# Patient Record
Sex: Male | Born: 1985 | Race: White | Hispanic: No | Marital: Single | Smoking: Never smoker
Health system: Southern US, Community
[De-identification: ages and names within clinical notes are randomized; demographics above are authoritative.]

## PROBLEM LIST (undated history)

## (undated) DIAGNOSIS — F419 Anxiety disorder, unspecified: Secondary | ICD-10-CM

## (undated) HISTORY — DX: Anxiety disorder, unspecified: F41.9

---

## 2006-03-04 ENCOUNTER — Ambulatory Visit: Payer: Self-pay | Admitting: Family Medicine

## 2007-11-11 ENCOUNTER — Ambulatory Visit: Payer: Self-pay | Admitting: Family Medicine

## 2007-12-12 ENCOUNTER — Ambulatory Visit: Payer: Self-pay | Admitting: Family Medicine

## 2008-12-26 ENCOUNTER — Telehealth: Payer: Self-pay | Admitting: Family Medicine

## 2008-12-28 ENCOUNTER — Ambulatory Visit: Payer: Self-pay | Admitting: Family Medicine

## 2008-12-31 ENCOUNTER — Encounter: Payer: Self-pay | Admitting: Family Medicine

## 2009-01-02 ENCOUNTER — Ambulatory Visit: Payer: Self-pay | Admitting: Family Medicine

## 2009-01-17 ENCOUNTER — Ambulatory Visit: Payer: Self-pay | Admitting: Family Medicine

## 2009-01-17 DIAGNOSIS — M549 Dorsalgia, unspecified: Secondary | ICD-10-CM | POA: Insufficient documentation

## 2009-02-07 ENCOUNTER — Ambulatory Visit: Payer: Self-pay | Admitting: Family Medicine

## 2009-02-07 DIAGNOSIS — F411 Generalized anxiety disorder: Secondary | ICD-10-CM | POA: Insufficient documentation

## 2009-02-12 ENCOUNTER — Ambulatory Visit: Payer: Self-pay | Admitting: Professional

## 2009-03-12 ENCOUNTER — Ambulatory Visit: Payer: Self-pay | Admitting: Family Medicine

## 2009-03-12 DIAGNOSIS — I861 Scrotal varices: Secondary | ICD-10-CM

## 2009-03-12 DIAGNOSIS — K648 Other hemorrhoids: Secondary | ICD-10-CM | POA: Insufficient documentation

## 2009-05-08 ENCOUNTER — Ambulatory Visit: Payer: Self-pay | Admitting: Family Medicine

## 2009-05-14 ENCOUNTER — Encounter: Payer: Self-pay | Admitting: Family Medicine

## 2009-08-13 ENCOUNTER — Ambulatory Visit: Payer: Self-pay | Admitting: Family Medicine

## 2009-08-15 LAB — CONVERTED CEMR LAB
AST: 15 units/L (ref 0–37)
Alkaline Phosphatase: 59 units/L (ref 39–117)
Basophils Absolute: 0.1 10*3/uL (ref 0.0–0.1)
Bilirubin, Direct: 0 mg/dL (ref 0.0–0.3)
Calcium: 9.3 mg/dL (ref 8.4–10.5)
Creatinine, Ser: 1 mg/dL (ref 0.4–1.5)
Eosinophils Absolute: 0.4 10*3/uL (ref 0.0–0.7)
GFR calc non Af Amer: 98.43 mL/min (ref >60)
Glucose, Bld: 85 mg/dL (ref 70–99)
HDL: 52.7 mg/dL (ref >39.00)
Hemoglobin: 15.1 g/dL (ref 13.0–17.0)
Lymphocytes Relative: 23.3 % (ref 12.0–46.0)
Monocytes Relative: 6.7 % (ref 3.0–12.0)
Neutro Abs: 5.7 10*3/uL (ref 1.4–7.7)
Neutrophils Relative %: 63.9 % (ref 43.0–77.0)
Platelets: 257 10*3/uL (ref 150.0–400.0)
RDW: 12.3 % (ref 11.5–14.6)
Sodium: 141 meq/L (ref 135–145)
Total Bilirubin: 1 mg/dL (ref 0.3–1.2)
Total CHOL/HDL Ratio: 3
Triglycerides: 73 mg/dL (ref 0.0–149.0)
VLDL: 14.6 mg/dL (ref 0.0–40.0)

## 2010-05-23 ENCOUNTER — Encounter: Payer: Self-pay | Admitting: Family Medicine

## 2010-05-23 ENCOUNTER — Ambulatory Visit
Admission: RE | Admit: 2010-05-23 | Discharge: 2010-05-23 | Payer: Self-pay | Source: Home / Self Care | Attending: Family Medicine | Admitting: Family Medicine

## 2010-06-24 NOTE — Assessment & Plan Note (Signed)
Summary: CPX AND 3RD HEP SHOT/DLO   Vital Signs:  Patient profile:   25 year old male Height:      73.5 inches Weight:      196.25 pounds BMI:     25.63 Temp:     98.4 degrees F oral Pulse rate:   60 / minute Pulse rhythm:   regular BP sitting:   118 / 80  (left arm) Cuff size:   regular  Vitals Entered By: Lewanda Rife LPN (August 13, 2009 10:45 AM)  History of Present Illness: here for health mt exam   is doing well - in school / going ok and almost done still working   stays up very late at night - home from work at 2 am   wt is up 7 lb  bp good 118/80  went to urologist in dec about d/c -- all was ok   for 3rd hep B shot today up to date Td 8/10  last visit started effexor for anx (paxil did not work) Careers information officer - helped at first - and then forgot to take it - and now he is off of it  wants to stay off it for a while now  anx is much better - though nothing else has changed   is eating a bit more healthy  more exercise than previously -- some housework and push ups  is having party for his birthday party   has mole behind R ear to check/ with scab feeling good overall      Allergies (verified): 1)  ! Paxil  Past History:  Past Medical History: Last updated: 02/07/2009 anxiety  Past Surgical History: Last updated: 11/11/2007 no surgeries   Family History: Last updated: 02/07/2009 Father: - ? perhaps anx  Mother:  Siblings: 1 sister no cancer in family sister - ? anxiety (social phobia)  Social History: Last updated: 12/28/2008 Marital Status:single Children:  Occupation: Archivist non smoker alcohol- occasional   Risk Factors: Smoking Status: never (11/10/2007)  Review of Systems General:  Denies fatigue, fever, loss of appetite, and malaise. Eyes:  Denies blurring and eye pain. CV:  Denies chest pain or discomfort, lightheadness, and palpitations. Resp:  Denies cough and wheezing. GI:  Denies abdominal pain, bloody stools,  change in bowel habits, indigestion, and nausea. GU:  Denies discharge, dysuria, hematuria, urinary frequency, and urinary hesitancy. MS:  Denies joint pain, joint redness, and joint swelling. Derm:  Denies itching, lesion(s), and rash. Neuro:  Denies numbness and tingling. Psych:  Denies anxiety and depression. Endo:  Denies cold intolerance, excessive thirst, excessive urination, and heat intolerance. Heme:  Denies abnormal bruising and bleeding.  Physical Exam  General:  Well-developed,well-nourished,in no acute distress; alert,appropriate and cooperative throughout examination Head:  normocephalic, atraumatic, and no abnormalities observed.   Eyes:  vision grossly intact, pupils equal, pupils round, and pupils reactive to light.  no conjunctival pallor, injection or icterus  Ears:  R ear normal and L ear normal.   Nose:  no nasal discharge.   Mouth:  pharynx pink and moist.   Neck:  supple with full rom and no masses or thyromegally, no JVD or carotid bruit  Chest Wall:  No deformities, masses, tenderness or gynecomastia noted. Lungs:  Normal respiratory effort, chest expands symmetrically. Lungs are clear to auscultation, no crackles or wheezes. Heart:  Normal rate and regular rhythm. S1 and S2 normal without gallop, murmur, click, rub or other extra sounds. Abdomen:  Bowel sounds positive,abdomen soft and non-tender without  masses, organomegaly or hernias noted. Msk:  No deformity or scoliosis noted of thoracic or lumbar spine.  no acute joint changes  Pulses:  R and L carotid,radial,femoral,dorsalis pedis and posterior tibial pulses are full and equal bilaterally Extremities:  No clubbing, cyanosis, edema, or deformity noted with normal full range of motion of all joints.   Neurologic:  sensation intact to light touch, gait normal, and DTRs symmetrical and normal.  no tremor  Skin:  Intact without suspicious lesions or rashes small SK behind R ear  few nl appearing brown nevi and  lentigos  Cervical Nodes:  No lymphadenopathy noted Inguinal Nodes:  No significant adenopathy Psych:  normal affect, talkative and pleasant  much less anxious today   Impression & Recommendations:  Problem # 1:  HEALTH MAINTENANCE EXAM (ICD-V70.0) Assessment Comment Only reviewed health habits including diet, exercise and skin cancer prevention reviewed health maintenance list and family history  lab today Orders: Venipuncture (88416) TLB-Lipid Panel (80061-LIPID) TLB-BMP (Basic Metabolic Panel-BMET) (80048-METABOL) TLB-CBC Platelet - w/Differential (85025-CBCD) TLB-Hepatic/Liver Function Pnl (80076-HEPATIC) TLB-TSH (Thyroid Stimulating Hormone) (84443-TSH)  Problem # 2:  ANXIETY DISORDER (ICD-300.00) Assessment: Improved much imp -off effexor (although that did help) now wants to stay off med  will keep me updated on progress commended him on this  The following medications were removed from the medication list:    Effexor Xr 37.5 Mg Xr24h-cap (Venlafaxine hcl) .Marland Kitchen... 1 by mouth once daily in am  Complete Medication List: 1)  Multivitamins Tabs (Multiple vitamin) .... Take 1 tablet by mouth once a day 2)  Anusol-hc 25 Mg Supp (Hydrocortisone acetate) .Marland Kitchen.. 1 per rectum at bedtime as needed hemorroid symptoms  Other Orders: Hepatitis B Vaccine ADOLESCENT (2 dose) (60630) Admin 1st Vaccine (16010)  Patient Instructions: 1)  try to keep working on healthy diet and exercise  2)  hep B shot today 3)  labs today 4)  hold on the effexor -- and if your anxiety increases start it again   Current Allergies (reviewed today): ! PAXIL    Immunizations Administered:  Hepatitis B Vaccine # 3:    Vaccine Type: HepB Adolescent    Site: left deltoid    Mfr: Merck    Dose: 1.0 ml    Route: IM    Given by: Lewanda Rife LPN    Exp. Date: 02/05/2011    Lot #: [id number]    VIS given: 12/09/05 version given August 13, 2009.

## 2010-06-26 NOTE — Assessment & Plan Note (Signed)
Summary: ANXIETY ATTACKS/UP ALL NIGHT/JRR   Vital Signs:  Patient profile:   25 year old male Height:      73.5 inches Weight:      202.25 pounds BMI:     26.42 Temp:     98.1 degrees F oral Pulse rate:   84 / minute Pulse rhythm:   regular BP sitting:   110 / 70  (left arm) Cuff size:   large  Vitals Entered By: Delilah Shan CMA Duncan Dull) (May 23, 2010 11:08 AM) CC: Anxiety attack   History of Present Illness: Anxiety- worries about possible looming illness.  Had to take a deep breath over and over last night.  Very nervous last night.  Every night during the last week, he was up worrying about heart trouble.  He doesn't want to exercise due to concern for heart trouble.  "Going on my whole life, but worse this past week."  Prev had mult checks prev "and my heart was always fine but I still worry about this."  Unclear trigger about making this past week worse.  Doing well in school and work.  No fear of crowds, germs.  No h/o racing thoughts, no h/o manic symptoms.  No SI/HI.  "I don't think I'm depressed."  Didn't do well with counseling.  No illicits.  Prev didn't stay on paxil for a prolonged period.  I d/w patient about this and evidently he wasn't consistent with the medicine to see if really had a chance to work.  He is interested in trying it again.   Family History: Reviewed history from 02/07/2009 and no changes required. Father: - alive, ? perhaps anx  Mother: alive Siblings: 1 sister no cancer in family sister - ? anxiety/depression (social phobia)  Social History: Reviewed history from 12/28/2008 and no changes required. Marital Status:single Children:  Occupation: Archivist, Arts administrator, business admin non smoker alcohol- rare  Review of Systems       See HPI.  Otherwise negative.    Physical Exam  General:  GEN: nad, alert and oriented, slighlty anxious but this improves during the conversation HEENT: mucous membranes moist NECK: supple w/o LA CV: rrr.   no murmur PULM: ctab, no inc wob EXT: no edema SKIN: no acute rash    Impression & Recommendations:  Problem # 1:  ANXIETY DISORDER (ICD-300.00)  >25 min spent with patient, at least half of which was spent on counseling.  He clarified the prev paxil use.  He reports he wasn't consistent with it and would like to try it again.  Will restart with typical precautions EA:VWUJ, libido.  Will call back with update. No SI/HI. Okay for outpatient follow up.    His updated medication list for this problem includes:    Paxil 20 Mg Tabs (Paroxetine hcl) .Marland Kitchen... 1 by mouth once daily  Complete Medication List: 1)  Paxil 20 Mg Tabs (Paroxetine hcl) .Marland Kitchen.. 1 by mouth once daily  Patient Instructions: 1)  Start the paxil and call me with an update in 1-2 weeks. Let's plan on meeting again for an OV in 6 weeks- .  Let me know if you mood is getting worse in the meantime.  Take care.  Prescriptions: PAXIL 20 MG TABS (PAROXETINE HCL) 1 by mouth once daily  #90 x 3   Entered and Authorized by:   Crawford Givens MD   Signed by:   Crawford Givens MD on 05/23/2010   Method used:   Print then Give to Patient  RxID:   [id number]    Orders Added: 1)  Est. Patient Level IV [40102]    Prior Medications (reviewed today): None

## 2010-06-26 NOTE — Letter (Signed)
Summary: Out of Work  [facility]  [address] [place], [place] [address]   Phone: [phone]  Fax: [phone]    May 23, 2010   Employee:  WINDSOR GOEKEN    To Whom It May Concern:   For Medical reasons, please excuse the above named employee from work for this AM.    If you need additional information, please feel free to contact our office.         Sincerely,    Crawford Givens MD

## 2010-07-04 ENCOUNTER — Encounter: Payer: Self-pay | Admitting: Family Medicine

## 2010-07-04 ENCOUNTER — Ambulatory Visit (INDEPENDENT_AMBULATORY_CARE_PROVIDER_SITE_OTHER): Payer: 59 | Admitting: Family Medicine

## 2010-07-04 DIAGNOSIS — R21 Rash and other nonspecific skin eruption: Secondary | ICD-10-CM

## 2010-07-04 DIAGNOSIS — F411 Generalized anxiety disorder: Secondary | ICD-10-CM

## 2010-07-16 NOTE — Assessment & Plan Note (Signed)
Summary: 6 WEEK FOLLOW UP/LFW   Vital Signs:  Patient profile:   25 year old male Height:      74.25 inches Weight:      204.25 pounds BMI:     26.14 Temp:     98.1 degrees F oral Pulse rate:   84 / minute Pulse rhythm:   regular BP sitting:   124 / 72  (left arm) Cuff size:   large  Vitals Entered By: Delilah Shan CMA (AAMA) (July 04, 2010 11:12 AM) CC: 6 week follow up   History of Present Illness: Anxiety- Dating and "I'm doing much better."  Rare lightheaded symptoms, usually happens when really tired.  Some tremor noted, no change.  Drinking a lot of soda.  Sleep is not much improved.  Not as worried but not sleeping very well.  Dec in BM frequency noted, now at least every other day.  "I think the worry is better."  Exercise: minimal.  No SI/HI.    Rash noted last night- not itchy, scattered papules that blanch on L side of trunk, nondermatomal.    Wart on L hand.  I talked with him. It's small and I rec OTC tx.    Allergies (verified): No Known Drug Allergies  Past History:  Past Medical History: Last updated: 02/07/2009 anxiety  Review of Systems       See HPI.  Otherwise negative.    Physical Exam  General:  no apparent distress normocephalic atraumatic mucous membranes moist neck supple, no LA regular rate and rhythm  clear to auscultation bilaterally nondermatomal nonexcoriated minimally erythematous blanching papules on chest wall w/o ulceration or fluctuance. small, all <1cm across.    Impression & Recommendations:  Problem # 1:  ANXIETY DISORDER (ICD-300.00) cont current meds, increase exercise, decrease soda.  No SI/HI.  Okay for outpatient follow up.  He agrees.  His updated medication list for this problem includes:    Paxil 20 Mg Tabs (Paroxetine hcl) .Marland Kitchen... 1 by mouth once daily  Problem # 2:  SKIN RASH (ICD-782.1) Unclear source.  Noted on trunk and I on L arm.  Not symptomatic, so I would observe and have patient follow up as needed,  if not resolving or if he develops symptoms.  He agrees.  Wart noted on hand, but I don't think this is related.   Complete Medication List: 1)  Paxil 20 Mg Tabs (Paroxetine hcl) .Marland Kitchen.. 1 by mouth once daily  Patient Instructions: 1)  I would get some compound W or other OTC wart medicine for your hand.  2)  Let me know if the rash on your chest doesn't go away.  Call me with an update in 1-2 weeks, sooner if needed. 3)  Don't change the paxil.  I don't want you to run out.   4)  Try to get more exercise and gradually cut down on soda.    Orders Added: 1)  Est. Patient Level III [13086]    Current Allergies (reviewed today): No known allergies

## 2010-08-28 ENCOUNTER — Encounter: Payer: Self-pay | Admitting: Family Medicine

## 2010-09-01 ENCOUNTER — Ambulatory Visit (INDEPENDENT_AMBULATORY_CARE_PROVIDER_SITE_OTHER): Payer: 59 | Admitting: Family Medicine

## 2010-09-01 ENCOUNTER — Encounter: Payer: Self-pay | Admitting: Family Medicine

## 2010-09-01 VITALS — BP 106/70 | HR 58 | Temp 98.6°F | Ht 74.25 in | Wt 203.8 lb

## 2010-09-01 DIAGNOSIS — Z209 Contact with and (suspected) exposure to unspecified communicable disease: Secondary | ICD-10-CM | POA: Insufficient documentation

## 2010-09-01 MED ORDER — PAROXETINE HCL 20 MG PO TABS: 20.0000 mg | ORAL_TABLET | ORAL | Status: DC

## 2010-09-01 NOTE — Progress Notes (Signed)
Addended byMills Koller on: 09/01/2010 11:15 AM   Modules accepted: Orders

## 2010-09-01 NOTE — Patient Instructions (Signed)
F/u Dr. Milinda Antis 6 weeks

## 2010-09-01 NOTE — Progress Notes (Signed)
25 year old male:  Anxiety is flaring up: Will stop his medicine, a lot better now, finished his first bottle The patient has had repetitive bouts of anxiety for multiple years, according to him since he was a child. He is seeing a psychiatrist even as early as childhood, and over the last 5 years or so he has been intermittently on medication. Most recently he was on Paxil, and he tolerated this without any problems. He was on 20 mg dose. Prior to this he was on Effexor XR, and tolerated this well, and had excellent relief of symptoms. Over time, the patient has not consistently taken his medication and has had multiple occurrences where he has had return of symptoms.  STD check:  +/- condoms The girlfriend for last 3 months, intercourse without condoms. Concerned about potential STDs. No other sexual partners in the last year.  No discharge or sores. He did have one small vesicular area at one time. This probably resolved and caused no difficulties.  The PMH, PSH, Social History, Family History, Medications, and allergies have been reviewed in [facility], and have been updated if relevant.  ROS: GEN: No acute illnesses, no fevers, chills. GI: No n/v/d, eating normally Pulm: No SOB Interactive and getting along well at home.  Otherwise, ROS is as per the HPI.   GEN: WDWN, NAD, Non-toxic, A & O x 3 HEENT: Atraumatic, Normocephalic. Neck supple. No masses, No LAD. Ears and Nose: No external deformity. GU: Normal external male, testes descended. 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 EXTR: No c/c/e NEURO Normal gait.  PSYCH: Normally interactive. Conversant. Not depressed or anxious appearing.  Calm demeanor.   Assessment and plan: Anxiety: Unstable, restart Paxil. Recheck in 6 weeks. With primary care provider. STD check: Check RPR, HIV, chlamydia and gonorrhea.

## 2010-09-02 LAB — RPR

## 2010-09-02 LAB — GC/CHLAMYDIA PROBE AMP, URINE
Chlamydia, Swab/Urine, PCR: NEGATIVE
GC Probe Amp, Urine: NEGATIVE

## 2010-12-12 ENCOUNTER — Encounter: Payer: Self-pay | Admitting: Internal Medicine

## 2010-12-12 ENCOUNTER — Ambulatory Visit (INDEPENDENT_AMBULATORY_CARE_PROVIDER_SITE_OTHER): Payer: 59 | Admitting: Internal Medicine

## 2010-12-12 VITALS — BP 116/80 | HR 62 | Temp 98.0°F | Ht 74.0 in | Wt 211.0 lb

## 2010-12-12 DIAGNOSIS — I498 Other specified cardiac arrhythmias: Secondary | ICD-10-CM | POA: Insufficient documentation

## 2010-12-12 DIAGNOSIS — R002 Palpitations: Secondary | ICD-10-CM

## 2010-12-12 DIAGNOSIS — F411 Generalized anxiety disorder: Secondary | ICD-10-CM

## 2010-12-12 LAB — CBC WITH DIFFERENTIAL/PLATELET
Basophils Relative: 0.5 % (ref 0.0–3.0)
Eosinophils Absolute: 0.5 10*3/uL (ref 0.0–0.7)
Hemoglobin: 15.5 g/dL (ref 13.0–17.0)
Lymphocytes Relative: 28 % (ref 12.0–46.0)
MCHC: 33.8 g/dL (ref 30.0–36.0)
Neutro Abs: 3.9 10*3/uL (ref 1.4–7.7)
RBC: 4.72 Mil/uL (ref 4.22–5.81)

## 2010-12-12 LAB — HEPATIC FUNCTION PANEL
ALT: 20 U/L (ref 0–53)
AST: 17 U/L (ref 0–37)
Alkaline Phosphatase: 73 U/L (ref 39–117)
Bilirubin, Direct: 0 mg/dL (ref 0.0–0.3)
Total Protein: 8 g/dL (ref 6.0–8.3)

## 2010-12-12 LAB — BASIC METABOLIC PANEL
Chloride: 104 mEq/L (ref 96–112)
GFR: 90.98 mL/min (ref >60.00)
Potassium: 4 mEq/L (ref 3.5–5.1)
Sodium: 141 mEq/L (ref 135–145)

## 2010-12-12 NOTE — Progress Notes (Signed)
  Subjective:    Patient ID: Roy Huber, male    DOB: May 05, 1986, 25 y.o.   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 Girlfriend has been good for her---she is a nurse  No current outpatient prescriptions on file prior to visit.    No Known Allergies  Past Medical History  Diagnosis Date  . Anxiety     No past surgical history on file.  Family History  Problem Relation Age of Onset  . Anxiety disorder Father   . Depression Sister     History   Social History  . Marital Status: Single    Spouse Name: N/A    Number of Children: N/A  . Years of Education: N/A   Occupational History  . Not on file.   Social History Main Topics  . Smoking status: Never Smoker   . Smokeless tobacco: Never Used  . Alcohol Use: Yes  . Drug Use: Not on file  . Sexually Active: Not on file   Other Topics Concern  . Not on file   Social History Narrative  . No narrative on file   Review of Systems Appetite is fine No heartburn usually--not related to spells Bowels fine--has had some red blood on paper or bowl. Has had fissures in past. Did have eval by Dr Milinda Antis last year and saw hemorrhoids No abd pain Weight has been going up     Objective:   Physical Exam  Constitutional: He appears well-developed  and well-nourished. No distress.  Neck: Normal range of motion. Neck supple. No thyromegaly present.  Cardiovascular: Normal rate, regular rhythm, normal heart sounds and intact distal pulses.  Exam reveals no gallop and no friction rub.   No murmur heard. Pulmonary/Chest: Effort normal and breath sounds normal. No respiratory distress. He has no wheezes. He has no rales.  Abdominal: Soft. He exhibits no mass. There is no tenderness.  Musculoskeletal: Normal range of motion. He exhibits no edema and no tenderness.  Lymphadenopathy:    He has no cervical adenopathy.  Psychiatric: His behavior is normal. Judgment and thought content normal.       ?slightly anxious but normal affect and appropriate speech, etc          Assessment & Plan:

## 2010-12-12 NOTE — Assessment & Plan Note (Addendum)
Sounds like extra beat (likely PAC) Not tachycardia Exam is reassuring EKG shows sinus bradycardia with arrhythmia  May be accentuated due to recent stress No reason for further eval except blood work reassured

## 2010-12-12 NOTE — Assessment & Plan Note (Signed)
Clearly has anxious personality He feels the symptoms are actually better in general Discussed the $4 paroxetine plan but for now, it seems reasonable to hold off on meds

## 2011-02-18 ENCOUNTER — Ambulatory Visit (INDEPENDENT_AMBULATORY_CARE_PROVIDER_SITE_OTHER): Payer: 59 | Admitting: Family Medicine

## 2011-02-18 ENCOUNTER — Encounter: Payer: Self-pay | Admitting: Family Medicine

## 2011-02-18 DIAGNOSIS — R221 Localized swelling, mass and lump, neck: Secondary | ICD-10-CM | POA: Insufficient documentation

## 2011-02-18 DIAGNOSIS — F411 Generalized anxiety disorder: Secondary | ICD-10-CM

## 2011-02-18 MED ORDER — PAROXETINE HCL 20 MG PO TABS: 20.0000 mg | ORAL_TABLET | Freq: Every day | ORAL | Status: DC

## 2011-02-18 NOTE — Assessment & Plan Note (Signed)
Still having fair amt of anxiety about his health  Did better on paxil and wants to restart it  Aware some of his compulsive thoughts (like worry about heart) are irrational  Is resistant to any more counseling Px paxil 20 mg Update if worse or side eff or SI F/u nov as planned

## 2011-02-18 NOTE — Progress Notes (Signed)
Subjective:    Patient ID: Roy Huber, male    DOB: August 23, 1985, 25 y.o.   MRN: [id number]  HPI Pt here c/o lump in throat L side and anxiety ? A bump Did not notice it until few months ago  Not not painful but noticeable  Can sense it when he swallows   Does not tend to have acid reflux  No uri symptoms  Wt is down 7 lb  Not a smoker or tab user   Wants to start back on his paxil  Helped a lot with anxiety- especially medical worry about heart  It allowed him to exercise again  Did not have side effects from that  Was on 20 mg   Not working out - is not in as good as shape  Gets out of breath more easily  Stress- hates his job Looking for another job currently  Just graduated - proud of that   Patient Active Problem List  Diagnoses  . ANGIOMA  . ANXIETY DISORDER  . 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 SCROTAL VARICES  . BACK PAIN  . SKIN RASH  . Exposure to communicable disease  . Palpitation  . Lump in throat   Past Medical History  Diagnosis Date  . Anxiety    No past surgical history on file. History  Substance Use Topics  . Smoking status: Never Smoker   . Smokeless tobacco: Never Used  . Alcohol Use: Yes   Family History  Problem Relation Age of Onset  . Anxiety disorder Father   . Thyroid disease Father     not sure if hypo- or hyper-  . Depression Sister    No Known Allergies No current outpatient prescriptions on file prior to visit.     Review of Systems Review of Systems  Constitutional: Negative for fever, appetite change, fatigue and unexpected weight change.  Eyes: Negative for pain and visual disturbance.  ENt neg for ear pain or ST  Respiratory: Negative for cough and shortness of breath.   Cardiovascular: Negative for cp or palpitations    Gastrointestinal: Negative for nausea, diarrhea and constipation.  Genitourinary: Negative for urgency and frequency.  Skin: Negative for pallor or rash   Neurological:  Negative for weakness, light-headedness, numbness and headaches.  Hematological: Negative for adenopathy. Does not bruise/bleed easily.  Psychiatric/Behavioral: Negative for dysphoric mood. Pos for anxiety, no SI         Objective:   Physical Exam  Constitutional: He appears well-developed and well-nourished. No distress.  HENT:  Head: Normocephalic and atraumatic.  Right Ear: External ear normal.  Left Ear: External ear normal.  Nose: Nose normal.  Mouth/Throat: Oropharynx is clear and moist. No oropharyngeal exudate.       Area of fullness on L side of throat appears to be some normal appearing redundant tissue- no redness or ulceration   Eyes: Conjunctivae and EOM are normal. Pupils are equal, round, and reactive to light. Right eye exhibits no discharge. Left eye exhibits no discharge.  Neck: Normal range of motion. Neck supple. No JVD present. No tracheal deviation present. No thyromegaly present.  Cardiovascular: Normal rate, regular rhythm, normal heart sounds and intact distal pulses.  Exam reveals no gallop.   No murmur heard. Pulmonary/Chest: Effort normal and breath sounds normal. No respiratory distress. He has no wheezes. He exhibits no tenderness.  Abdominal: Soft. Bowel sounds are normal. He exhibits no distension and no mass. There is no tenderness.  Musculoskeletal: Normal range of  motion. He exhibits no edema and no tenderness.  Lymphadenopathy:    He has no cervical adenopathy.  Neurological: He is alert. He has normal reflexes. Coordination normal.       slt hand tremor  Skin: Skin is warm and dry. No rash noted. No erythema. No pallor.       No adenopathy detected in any area   Psychiatric:       Overall anxious  Voices various concerns about his health and heart Admits these are largely irrational and he needs help with anxiety          Assessment & Plan:

## 2011-02-18 NOTE — Patient Instructions (Addendum)
I do not think the lump in your throat is worrisome but I want to watch it  Let's re check it in November - but if it bothers you more in the meantime- call  Start back on your paxil for anxiety and health worries If any side effects or problems let me know Follow up in November as planned

## 2011-02-18 NOTE — Assessment & Plan Note (Signed)
Tissue in L side of throat looks to be normal fold/ mucosa but will watch it for growth or change Pt will call if discomfort in the meantime Will re check at PE in November

## 2011-03-21 ENCOUNTER — Encounter: Payer: Self-pay | Admitting: Family Medicine

## 2011-03-21 ENCOUNTER — Ambulatory Visit (INDEPENDENT_AMBULATORY_CARE_PROVIDER_SITE_OTHER): Payer: 59 | Admitting: Family Medicine

## 2011-03-21 VITALS — BP 122/78 | HR 64 | Temp 97.6°F | Wt 199.0 lb

## 2011-03-21 DIAGNOSIS — R05 Cough: Secondary | ICD-10-CM | POA: Insufficient documentation

## 2011-03-21 MED ORDER — BENZONATATE 200 MG PO CAPS: 200.0000 mg | ORAL_CAPSULE | Freq: Three times a day (TID) | ORAL | Status: AC | PRN

## 2011-03-21 NOTE — Assessment & Plan Note (Signed)
Likely postviral cough. All sx are much improved except for cough and this is gradually improved.  Nontoxic.  ddx d/w pt.  Likely postviral.  Supportive tx and tessalon.  Timeline d/w pt.  He understood.

## 2011-03-21 NOTE — Patient Instructions (Signed)
Drink plenty of fluids, take tessalon as needed, and this should gradually (slowly) improve.  Take care.  Let us know if you have other concerns.

## 2011-03-21 NOTE — Progress Notes (Signed)
He started with URI sx, just about all sx better except for dry cough which is gradually getting better (but very slowly) duration of symptoms: a few weeks Rhinorrhea: yes prev, better now Congestion: yes prev, better now ear pain: no sore throat: initially, yes, but some better now Cough: no sputum.  Is worse at night.  Tickle in throat and then has a coughing fit.   Myalgias: no other concerns:no fevers No cough meds.    ROS: See HPI.  Otherwise negative.    Meds, vitals, and allergies reviewed.   GEN: nad, alert and oriented HEENT: mucous membranes moist, TM w/o erythema, some cerumen in canal, nasal epithelium minimally injected, OP with minimal cobblestoning NECK: supple w/o LA CV: rrr. PULM: ctab, no inc wob EXT: no edema

## 2011-03-22 ENCOUNTER — Telehealth: Payer: Self-pay | Admitting: Family Medicine

## 2011-03-22 DIAGNOSIS — Z0000 Encounter for general adult medical examination without abnormal findings: Secondary | ICD-10-CM | POA: Insufficient documentation

## 2011-03-22 NOTE — Telephone Encounter (Signed)
Message copied by Judy Pimple on Sun Mar 22, 2011 10:08 PM ------      Message from: Roy Huber      Created: Thu Mar 19, 2011  8:48 AM      Regarding: Cpx labs Tues10/30       Please order  future cpx labs for pt's upcomming lab appt.      Thanks      Rodney Booze

## 2011-03-24 ENCOUNTER — Other Ambulatory Visit (INDEPENDENT_AMBULATORY_CARE_PROVIDER_SITE_OTHER): Payer: 59

## 2011-03-24 DIAGNOSIS — Z0000 Encounter for general adult medical examination without abnormal findings: Secondary | ICD-10-CM

## 2011-03-24 LAB — CBC WITH DIFFERENTIAL/PLATELET
Basophils Absolute: 0.1 10*3/uL (ref 0.0–0.1)
Eosinophils Absolute: 0.4 10*3/uL (ref 0.0–0.7)
Lymphocytes Relative: 39.2 % (ref 12.0–46.0)
MCHC: 34 g/dL (ref 30.0–36.0)
Neutrophils Relative %: 46.3 % (ref 43.0–77.0)
Platelets: 327 10*3/uL (ref 150.0–400.0)
RBC: 4.64 Mil/uL (ref 4.22–5.81)
RDW: 12.6 % (ref 11.5–14.6)

## 2011-03-24 LAB — COMPREHENSIVE METABOLIC PANEL
ALT: 19 U/L (ref 0–53)
AST: 15 U/L (ref 0–37)
Albumin: 4.3 g/dL (ref 3.5–5.2)
Alkaline Phosphatase: 87 U/L (ref 39–117)
Calcium: 8.8 mg/dL (ref 8.4–10.5)
Chloride: 104 mEq/L (ref 96–112)
Potassium: 3.5 mEq/L (ref 3.5–5.1)
Sodium: 141 mEq/L (ref 135–145)

## 2011-03-24 LAB — LIPID PANEL
Cholesterol: 127 mg/dL (ref 0–200)
LDL Cholesterol: 77 mg/dL (ref 0–99)

## 2011-03-24 LAB — TSH: TSH: 1.46 u[IU]/mL (ref 0.35–5.50)

## 2011-03-30 ENCOUNTER — Encounter: Payer: Self-pay | Admitting: Family Medicine

## 2011-03-30 ENCOUNTER — Ambulatory Visit (INDEPENDENT_AMBULATORY_CARE_PROVIDER_SITE_OTHER): Payer: 59 | Admitting: Family Medicine

## 2011-03-30 DIAGNOSIS — D1800 Hemangioma unspecified site: Secondary | ICD-10-CM

## 2011-03-30 DIAGNOSIS — M25529 Pain in unspecified elbow: Secondary | ICD-10-CM

## 2011-03-30 DIAGNOSIS — Z209 Contact with and (suspected) exposure to unspecified communicable disease: Secondary | ICD-10-CM

## 2011-03-30 DIAGNOSIS — F411 Generalized anxiety disorder: Secondary | ICD-10-CM

## 2011-03-30 DIAGNOSIS — I861 Scrotal varices: Secondary | ICD-10-CM

## 2011-03-30 DIAGNOSIS — K648 Other hemorrhoids: Secondary | ICD-10-CM

## 2011-03-30 DIAGNOSIS — M722 Plantar fascial fibromatosis: Secondary | ICD-10-CM

## 2011-03-30 DIAGNOSIS — Z0000 Encounter for general adult medical examination without abnormal findings: Secondary | ICD-10-CM

## 2011-03-30 DIAGNOSIS — M25522 Pain in left elbow: Secondary | ICD-10-CM

## 2011-03-30 NOTE — Assessment & Plan Note (Signed)
Medial epicodylitis with clicking  Will try elbow brace when bothersome- from drugstore Also ice can help  Update if not improved

## 2011-03-30 NOTE — Assessment & Plan Note (Addendum)
Intermittent discomfort and BRB streak on tissue when he strains  Has been problem intermittently without abd pain /wt change or other symptoms Urged to keep stools soft - with more fiber and water  If still very bothersome - will consider surg or GI ref

## 2011-03-30 NOTE — Assessment & Plan Note (Signed)
Re assured that skin cherry angiomas are not malignant One behind L ear - is at risk for getting cut with a haircut-so will watch this  If he wants it removed- will ref to derm Also disc imp of sunscreen use

## 2011-03-30 NOTE — Progress Notes (Signed)
Subjective:    Patient ID: Roy Huber, male    DOB: 03-Aug-1985, 25 y.o.   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   Sometimes bms are painful from hemorroids - with brb  Happens when he has a really hard bm   Has been feeling good  A lot better anxiety wise  Stopped his paxil- was light headed and nauseated- after 2 weeks just stopped it (did not happen to him the first time)  Has been feeling better- is distracted by work - and that is very good   Is doing better with diet  Quit soda for 52 days- got hooked on it - now is almost quit again  Is eating a more balanced diet  Is doing yoga  He really likes it   bp is perfect 124/66  Needs a flu shot  Td is 2010   Lab Results  Component Value Date   CHOL 127 03/24/2011   CHOL 157 08/13/2009   Lab Results  Component Value Date   HDL 40.40 03/24/2011   HDL 52.70 08/13/2009   Lab Results  Component Value Date   LDLCALC 77 03/24/2011   LDLCALC 90 08/13/2009   Lab Results  Component Value Date   TRIG 47.0 03/24/2011   TRIG 73.0 08/13/2009   Lab Results  Component Value Date   CHOLHDL 3 03/24/2011   CHOLHDL 3 08/13/2009   No results found for this basename: LDLDIRECT    All other labs ok   Patient Active Problem List  Diagnoses  . ANXIETY DISORDER  . 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 SCROTAL VARICES  . Exposure to communicable disease  . Palpitation  .  Routine general medical examination at a health care facility  . Plantar fasciitis  . Angioma  . Left elbow pain   Past Medical History  Diagnosis Date  . Anxiety    No past surgical history on file. History  Substance Use Topics  . Smoking status: Never Smoker   . Smokeless tobacco: Never Used  . Alcohol Use: Yes   Family History  Problem Relation Age of Onset  . Anxiety disorder Father   . Thyroid disease Father     not sure if hypo- or hyper-  . Depression Sister    No Known Allergies No current outpatient prescriptions on file prior to visit.         Review of Systems Review of Systems  Constitutional: Negative for fever, appetite change, fatigue and unexpected weight change.  Eyes: Negative for pain and visual disturbance.  Respiratory: Negative for cough and shortness of breath.   Cardiovascular: Negative for cp , some occ palpitations when he is anxious  Gastrointestinal: Negative for nausea, diarrhea and constipation. occ rectal pain /  brb on tissue Genitourinary: Negative for urgency and frequency. no discharge  Skin: Negative for pallor or rash   MSK pos for elbow pain and heel pain without swelling  Neurological: Negative for weakness, light-headedness, numbness and headaches.  Hematological: Negative for adenopathy. Does not bruise/bleed easily.  Psychiatric/Behavioral: Negative for dysphoric mood. The patient is anxious and worries excessively about his health.          Objective:   Physical Exam  Constitutional: He appears well-developed and well-nourished. No distress.  HENT:  Head: Normocephalic and atraumatic.  Right Ear: External ear normal.  Left Ear: External ear normal.  Nose: Nose normal.  Mouth/Throat: Oropharynx is clear and moist.  Eyes: Conjunctivae and EOM are normal. Pupils are equal, round, and reactive to light. No scleral icterus.  Neck: Normal range of motion. Neck supple. No JVD present. Carotid bruit is not present. No  thyromegaly present.  Cardiovascular: Normal rate, regular rhythm, normal heart sounds and intact distal pulses.  Exam reveals no gallop.   Pulmonary/Chest: Effort normal and breath sounds normal. No respiratory distress. He has no wheezes. He exhibits no tenderness.  Abdominal: Soft. Bowel sounds are normal. He exhibits no distension and no mass. There is no tenderness. There is no guarding.  Genitourinary: Rectum normal and penis normal. No penile tenderness.       Scrotal varices are present- baseline  Musculoskeletal: Normal range of motion. He exhibits no edema and no tenderness.       Bilateral mild heel tenderness  L elbow - medial tenderness over epicondyle - with nl rom  No swelling   Lymphadenopathy:    He has no cervical adenopathy.  Neurological: He is alert. He has normal reflexes. No cranial nerve deficit. Coordination normal.  Skin: Skin is warm and dry. No rash noted. No erythema. No pallor.       Small angiomas diffusely  Psychiatric:       Generally anxious with somewhat rapid speech  Talkative           Assessment & Plan:

## 2011-03-30 NOTE — Assessment & Plan Note (Signed)
Unchanged No masses noted on exam  Will continue to follow  Re assured  If pain or other symptoms - let me know

## 2011-03-30 NOTE — Assessment & Plan Note (Signed)
Reviewed health habits including diet and exercise and skin cancer prevention Also reviewed health mt list, fam hx and immunizations  Rev wellness lab today

## 2011-03-30 NOTE — Patient Instructions (Signed)
Use ice on elbow - let me know if it gets worse - here is handout For plantar fasciitis - use frozen can to roll feet over in am for 5 minutes and wear supportive shoes - here is handout  ABCD for mole check -- assymetry , border irregularity (not smooth), color (being different throughout the mole) , and diameter over 6 mm (the size of a pencil eraser Wear your sunscreen  Eat healthy diet - give up sodas  Keep exercising Scrotal varices are re assuring- no change  For hemorroids - work on keeping stools  Very soft with fiber/ water - fruit and veg  If worse pain or blood in stool - please let me know  I do think eventually we are going to need to treat your anxiety again

## 2011-03-30 NOTE — Assessment & Plan Note (Signed)
Given handout Enc to wear shoes at all times for support  Think about change in athletic or work shoes with more support Can try advil if really bothersome  Can try ice also

## 2011-03-30 NOTE — Assessment & Plan Note (Signed)
Pt stopped paxil for side effects - did not have previously Due to work schedule and distraction- overall doing better Does not want to start new med or counseling Still has preoccupation with health - list of many questions today I predict we will need to re visit this in the future

## 2011-08-18 ENCOUNTER — Telehealth: Payer: Self-pay | Admitting: Family Medicine

## 2011-08-18 NOTE — Telephone Encounter (Signed)
Patient scheduled appointment on 08/24/11 @ 3:00.

## 2011-08-18 NOTE — Telephone Encounter (Signed)
Patient's mother called requesting an appointment for patient to see Dr.Duncan to discuss his anxiety medication.  Patient"s PCP is Dr.Tower.  Patient's mother said patient requested Dr.Duncan because he felt more comfortable discussing it with him.  Patient's mother said patient would like to switch to  Dr. Para March.  Can patient switch?

## 2011-08-18 NOTE — Telephone Encounter (Signed)
Okay with me if okay with Tower.  Would need OV.

## 2011-08-18 NOTE — Telephone Encounter (Signed)
Please make the change and set up OV.

## 2011-08-18 NOTE — Telephone Encounter (Signed)
That is fine with me - very nice patient - I'm sure he would be more comfortable with a male physician

## 2011-08-24 ENCOUNTER — Ambulatory Visit (INDEPENDENT_AMBULATORY_CARE_PROVIDER_SITE_OTHER): Payer: 59 | Admitting: Family Medicine

## 2011-08-24 ENCOUNTER — Encounter: Payer: Self-pay | Admitting: Family Medicine

## 2011-08-24 VITALS — BP 112/64 | HR 60 | Temp 98.1°F | Wt 203.0 lb

## 2011-08-24 DIAGNOSIS — F411 Generalized anxiety disorder: Secondary | ICD-10-CM

## 2011-08-24 MED ORDER — CITALOPRAM HYDROBROMIDE 20 MG PO TABS: 10.0000 mg | ORAL_TABLET | Freq: Every day | ORAL | Status: DC

## 2011-08-24 NOTE — Progress Notes (Signed)
26 y/o looking for another job. In stable relationship.  No illicits.  H/o anxiety, "they majority of my life."  Worse since childhood.  Affecting his life- he worries about his heart and this limits his exercise (he doesn't want to strain his heart).  He's trying to cut back on soda and work on diet.  He's had nightmares about heart surgery.  We talked about prev paxil use.  He would prev feel better but then he'd stop it.  He got dizzy and nauseated on paxil with recent trial and stopped it.    Meds, vitals, and allergies reviewed.   ROS: See HPI.  Otherwise, noncontributory.  GEN: nad, alert and oriented HEENT: mucous membranes moist NECK: supple w/o LA CV: rrr.  no murmur PULM: ctab, no inc wob ABD: soft, +bs EXT: no edema SKIN: no acute rash No tremor.  Slightly anxious but judgement appears intact

## 2011-08-24 NOTE — Patient Instructions (Signed)
Start taking celexa 10mg  a day (1/2 of the 20mg  tab) and call me with an update in about 2 weeks, sooner if needed.  I would gradually get back into an exercise routine- start with walking and gradually increase.  Take care.

## 2011-08-25 NOTE — Assessment & Plan Note (Signed)
Start celexa, d/w pt.  He'll call back with update.  No SI/HI.  He isn't enthused about counseling.  Will try this for now and follow clinically.  I reassured him about his CV status and encouraged exercise.  >25 min spent with face to face with patient, >50% counseling and/or coordinating care

## 2011-10-01 ENCOUNTER — Encounter (HOSPITAL_COMMUNITY): Payer: Self-pay | Admitting: *Deleted

## 2011-10-01 ENCOUNTER — Emergency Department (HOSPITAL_COMMUNITY)
Admission: EM | Admit: 2011-10-01 | Discharge: 2011-10-01 | Disposition: A | Discharge status: Home / Self Care | Payer: BC Managed Care – PPO | Source: Home / Self Care | Attending: Family Medicine | Admitting: Family Medicine

## 2011-10-01 DIAGNOSIS — R002 Palpitations: Secondary | ICD-10-CM

## 2011-10-01 NOTE — Discharge Instructions (Signed)
Palpitations  A palpitation is the feeling that your heartbeat is irregular or is faster than normal. Although this is frightening, it usually is not serious. Palpitations may be caused by excesses of smoking, caffeine, or alcohol. They are also brought on by stress and anxiety. Sometimes, they are caused by heart disease. Unless otherwise noted, your caregiver did not find any signs of serious illness at this time. HOME CARE INSTRUCTIONS  To help prevent palpitations:  Drink decaffeinated coffee, tea, and soda pop. Avoid chocolate.   If you smoke or drink alcohol, quit or cut down as much as possible.   Reduce your stress or anxiety level. Biofeedback, yoga, or meditation will help you relax. Physical activity such as swimming, jogging, or walking also may be helpful.  SEEK MEDICAL CARE IF:   You continue to have a fast heartbeat.   Your palpitations occur more often.  SEEK IMMEDIATE MEDICAL CARE IF: You develop chest pain, shortness of breath, severe headache, dizziness, or fainting. Document Released: 05/08/2000 Document Revised: 04/30/2011 Document Reviewed: 07/08/2007 Exit[employer] Patient Information 2012 [employer]. 

## 2011-10-01 NOTE — ED Provider Notes (Signed)
History     CSN: [id number]  Arrival date & time 10/01/11  1729   First MD Initiated Contact with Patient 10/01/11 1844      Chief Complaint  Patient presents with  . Irregular Heart Beat    (Consider location/radiation/quality/duration/timing/severity/associated sxs/prior treatment) Patient is a 26 y.o. male presenting with palpitations. The history is provided by the patient. No language interpreter was used.  Palpitations  This is a new problem. The problem occurs constantly. The problem is associated with anxiety and stress. Associated symptoms include irregular heartbeat. He has tried nothing for the symptoms. Risk factors include no known risk factors.  Pt  Complains of heart missing beats and feeling irregular  Past Medical History  Diagnosis Date  . Anxiety     prev was paxil, w/o good response 2012    History reviewed. No pertinent past surgical history.  Family History  Problem Relation Age of Onset  . Anxiety disorder Father   . Thyroid disease Father     not sure if hypo- or hyper-  . Depression Sister     History  Substance Use Topics  . Smoking status: Never Smoker   . Smokeless tobacco: Never Used  . Alcohol Use: Yes     occ      Review of Systems  Cardiovascular: Positive for palpitations.  All other systems reviewed and are negative.    Allergies  Paroxetine hcl  Home Medications   Current Outpatient Rx  Name Route Sig Dispense Refill  . CITALOPRAM HYDROBROMIDE 20 MG PO TABS Oral Take 0.5 tablets (10 mg total) by mouth daily. 30 tablet 3  . ONE-DAILY MULTI VITAMINS PO TABS Oral Take 1 tablet by mouth daily.      BP 143/88  Pulse 73  Temp(Src) 98.4 F (36.9 C) (Oral)  Resp 18  SpO2 99%  Physical Exam  Constitutional: He appears well-developed and well-nourished.  HENT:  Head: Normocephalic and atraumatic.  Eyes: Pupils are equal, round, and reactive to light.  Pulmonary/Chest: Effort normal and breath sounds normal.    Abdominal: Soft. Bowel sounds are normal.  Musculoskeletal: Normal range of motion.  Neurological: He is alert.  Skin: Skin is warm.  Psychiatric: He has a normal mood and affect.    ED Course  Procedures (including critical care time)  Labs Reviewed - No data to display No results found.   No diagnosis found.    MDM   Date: 10/01/2011  Rate:70  Rhythm: normal sinus rhythm and sinus arrhythmia  QRS Axis: normal  Intervals: normal  ST/T Wave abnormalities: normal  Conduction Disutrbances:none  Narrative Interpretation:   Old EKG Reviewed: none available     Pt counseled on palpitations,  I advised decrease caffeine   Lonia Skinner [place], [place] 10/01/11 1904

## 2011-10-01 NOTE — ED Notes (Signed)
Pt  Reports  intermittant  Episodes  Of  What  He  Describes  As  An irregular  Heartbeat  -  Skipping  A  Beat     He  Does  Report  Feeling  Anxious        Which  He  States  He  Always  Does   -  He  denys  Any  Current  Chest pains       He  Is  Sitting upright on  Exam table     Speaking in  comlete  sentances  And  Is  In no acute  Distress

## 2011-10-02 NOTE — ED Provider Notes (Signed)
Medical screening examination/treatment/procedure(s) were performed by non-physician practitioner and as supervising physician I was immediately available for consultation/collaboration.   MORENO-COLL,Legacie Dillingham; MD   Kumar Falwell Moreno-Coll, MD 10/02/11 0826 

## 2011-11-03 ENCOUNTER — Ambulatory Visit (INDEPENDENT_AMBULATORY_CARE_PROVIDER_SITE_OTHER): Payer: BC Managed Care – PPO | Admitting: Family Medicine

## 2011-11-03 ENCOUNTER — Encounter: Payer: Self-pay | Admitting: Family Medicine

## 2011-11-03 VITALS — BP 100/78 | HR 66 | Temp 98.0°F | Wt 194.0 lb

## 2011-11-03 DIAGNOSIS — F411 Generalized anxiety disorder: Secondary | ICD-10-CM

## 2011-11-03 DIAGNOSIS — M79609 Pain in unspecified limb: Secondary | ICD-10-CM

## 2011-11-03 DIAGNOSIS — M79673 Pain in unspecified foot: Secondary | ICD-10-CM | POA: Insufficient documentation

## 2011-11-03 MED ORDER — CITALOPRAM HYDROBROMIDE 20 MG PO TABS: 20.0000 mg | ORAL_TABLET | Freq: Every day | ORAL | Status: DC

## 2011-11-03 NOTE — Assessment & Plan Note (Signed)
Likely mild tendonitis and I would get more padding in shoes, ie soft inserts with arch support.  Ice and nsaids with GI caution . F/u prn.

## 2011-11-03 NOTE — Patient Instructions (Addendum)
Increase the celexa to 20mg  and let us know if that doesn't help.   It appears that you have a mild tendonitis and this should improve with ice, rest, and soft arch support inserts.  Take 200mg  ibuprofen, up to 3 at a time up to 3 times a day with food, if needed for pain.

## 2011-11-03 NOTE — Assessment & Plan Note (Signed)
Inc celexa to 20mg  a day and f/u prn.  Doing well.  No Si/Hi.

## 2011-11-03 NOTE — Progress Notes (Signed)
Anxiety.  Celexa had helped some prev but the effect had worn off.  No ADE.  Still on the medicine, 10mg  a day.  No Si/Hi.  We talked about increasing the dose.   L foot pain, dorsum, medial to the extensor tendon sheath on great toe, worse when putting weight on the medial side of the foot, with activity.  Gradually worse over the softball season.  Didn't improve with cleats vs soft shoes.  No meds yet, no ice yet.  Going on for 2-3 months.    Meds, vitals, and allergies reviewed.   ROS: See HPI.  Otherwise, noncontributory.  nad ncat L foot with normal inspection.  Mortise stable.  Hop test neg Foot not ttp at all except for medial to (but not on) the extensor tendon sheath on the L 1st toe with resisted internal rotation.  Not red or puffy.  Normal ROM o/w.  Distally NV intact

## 2011-12-09 ENCOUNTER — Encounter: Payer: Self-pay | Admitting: Family Medicine

## 2011-12-09 ENCOUNTER — Ambulatory Visit (INDEPENDENT_AMBULATORY_CARE_PROVIDER_SITE_OTHER): Payer: BC Managed Care – PPO | Admitting: Family Medicine

## 2011-12-09 VITALS — BP 118/74 | HR 58 | Temp 98.1°F | Ht 73.0 in | Wt 195.0 lb

## 2011-12-09 DIAGNOSIS — R1013 Epigastric pain: Secondary | ICD-10-CM | POA: Insufficient documentation

## 2011-12-09 DIAGNOSIS — K2970 Gastritis, unspecified, without bleeding: Secondary | ICD-10-CM | POA: Insufficient documentation

## 2011-12-09 LAB — COMPREHENSIVE METABOLIC PANEL
Albumin: 4.5 g/dL (ref 3.5–5.2)
Alkaline Phosphatase: 73 U/L (ref 39–117)
BUN: 11 mg/dL (ref 6–23)
CO2: 28 mEq/L (ref 19–32)
Calcium: 9.1 mg/dL (ref 8.4–10.5)
Chloride: 103 mEq/L (ref 96–112)
GFR: 94.34 mL/min (ref >60.00)
Glucose, Bld: 99 mg/dL (ref 70–99)
Potassium: 4.1 mEq/L (ref 3.5–5.1)
Total Protein: 7.4 g/dL (ref 6.0–8.3)

## 2011-12-09 LAB — CBC WITH DIFFERENTIAL/PLATELET
Basophils Relative: 0.7 % (ref 0.0–3.0)
Eosinophils Relative: 10.8 % — ABNORMAL HIGH (ref 0.0–5.0)
MCV: 97.5 fl (ref 78.0–100.0)
Monocytes Absolute: 0.5 10*3/uL (ref 0.1–1.0)
Monocytes Relative: 8.5 % (ref 3.0–12.0)
Neutrophils Relative %: 45.9 % (ref 43.0–77.0)
RBC: 4.59 Mil/uL (ref 4.22–5.81)
WBC: 6.3 10*3/uL (ref 4.5–10.5)

## 2011-12-09 LAB — LIPASE: Lipase: 23 U/L (ref 11.0–59.0)

## 2011-12-09 LAB — AMYLASE: Amylase: 62 U/L (ref 27–131)

## 2011-12-09 MED ORDER — OMEPRAZOLE 20 MG PO CPDR: 20.0000 mg | DELAYED_RELEASE_CAPSULE | Freq: Every day | ORAL | Status: DC

## 2011-12-09 NOTE — Progress Notes (Signed)
Subjective:    Patient ID: Roy Huber, male    DOB: August 06, 1985, 26 y.o.   MRN: [id number]  HPI Lately is having pains in his side (L side of abd)- and now more in middle of stomach (epigastric area)  Going on for 2 weeks Comes and goes - some sharp and dull feature  Does not feel like a hunger pain or a cramp  Eating does not affect it  No gas  No n/v at all  ? If tender- hard to tell No fever , no sick contacts   No med otc    occ takes ibuprofen - twice a week at most  Does drink caffeine occ alcohol- does not binge No heartburn   Blood in stool - not as bright red as usual  Enough to color the toilet-- only happens to have bm  occ has to pass a hard stool  Has a hx of hemorroids  No constipation or diarrhe   No fam hx of bowel dz or colon cancer   He personally has been very stressed lately Bought a house with his girlfriend and then got laid off from his job Is searching for another with financial pressure right now   Patient Active Problem List  Diagnosis  . ANXIETY DISORDER  . 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 SCROTAL VARICES  . Exposure to communicable disease  . Palpitation  . Routine general medical examination at a health care facility  . Plantar fasciitis  . Angioma  . Left elbow pain  . Foot pain   Past Medical History  Diagnosis Date  . Anxiety     prev was paxil, w/o good response 2012   No past surgical history on file. History  Substance Use Topics  . Smoking status: Never Smoker   . Smokeless tobacco: Never Used  . Alcohol Use: Yes     occ   Family History  Problem Relation Age of Onset  . Anxiety disorder Father   . Thyroid disease Father     not sure if hypo- or hyper-  . Depression Sister    Allergies  Allergen Reactions  . Paroxetine Hcl     nausea   Current Outpatient Prescriptions on File Prior to Visit  Medication Sig Dispense Refill  . citalopram (CELEXA) 20 MG tablet Take 1 tablet (20 mg total) by  mouth daily.  30 tablet  5  . Multiple Vitamin (MULTIVITAMIN) tablet Take 1 tablet by mouth daily.          Review of Systems Review of Systems  Constitutional: Negative for fever, appetite change, fatigue and unexpected weight change.  Eyes: Negative for pain and visual disturbance.  Respiratory: Negative for cough and shortness of breath.   Cardiovascular: Negative for cp or palpitations    Gastrointestinal: Negative for nausea, diarrhea and constipation. pos for epigastric pain that is intermittent  Genitourinary: Negative for urgency and frequency.  Skin: Negative for pallor or rash   Neurological: Negative for weakness, light-headedness, numbness and headaches.  Hematological: Negative for adenopathy. Does not bruise/bleed easily.  Psychiatric/Behavioral: Negative for dysphoric mood. The patient is not nervous/anxious.  pos for more stress recently       Objective:   Physical Exam  Constitutional: He appears well-developed and well-nourished. No distress.  HENT:  Head: Normocephalic and atraumatic.  Mouth/Throat: Oropharynx is clear and moist.  Eyes: Conjunctivae and EOM are normal. Pupils are equal, round, and reactive to light. No scleral icterus.  Neck: Normal  range of motion. Neck supple. No JVD present. No thyromegaly present.  Cardiovascular: Normal rate, regular rhythm, normal heart sounds and intact distal pulses.  Exam reveals no gallop.   Pulmonary/Chest: Effort normal and breath sounds normal. No respiratory distress. He has no wheezes.  Abdominal: Soft. Bowel sounds are normal. He exhibits no distension, no abdominal bruit, no ascites and no mass. There is no hepatosplenomegaly. There is tenderness in the epigastric area and left upper quadrant. There is no rebound, no guarding and no CVA tenderness.  Musculoskeletal: Normal range of motion. He exhibits no edema.  Lymphadenopathy:    He has no cervical adenopathy.  Neurological: He is alert. He has normal reflexes.    Skin: Skin is warm and dry. No rash noted. No erythema. No pallor.  Psychiatric: He has a normal mood and affect.          Assessment & Plan:

## 2011-12-09 NOTE — Assessment & Plan Note (Signed)
I suspect this is likely gastritis due to location and nature of pain and also assoc with stress lately  Will tx with omeprazole 20 mg daily and diet change Also labs today and update F/u 2-3 wk  If worse adv to call / and f/u in the meantime  Blood in stool is recurrent and most likely due to hemorrhoids - but need to watch this - if it continues will need additional eval

## 2011-12-09 NOTE — Assessment & Plan Note (Signed)
See assessment for abd pain -epigastric Suspect rel to stress Will try PPI  Also lab today F/u planned

## 2011-12-09 NOTE — Patient Instructions (Addendum)
Take the omeprazole (prilosec) daily for stomach acid Labs today  Avoid caffeine and spicy foods  For constipation and hemorrhoids - get a stool softener like colace and take as directed with lots of water Follow up with me in 2-3 weeks Update if not starting to improve in a week or if worsening

## 2011-12-30 ENCOUNTER — Encounter: Payer: Self-pay | Admitting: Family Medicine

## 2011-12-30 ENCOUNTER — Ambulatory Visit (INDEPENDENT_AMBULATORY_CARE_PROVIDER_SITE_OTHER): Payer: BC Managed Care – PPO | Admitting: Family Medicine

## 2011-12-30 VITALS — BP 118/64 | HR 61 | Temp 98.0°F | Ht 73.0 in | Wt 198.2 lb

## 2011-12-30 DIAGNOSIS — R1013 Epigastric pain: Secondary | ICD-10-CM

## 2011-12-30 DIAGNOSIS — K2970 Gastritis, unspecified, without bleeding: Secondary | ICD-10-CM

## 2011-12-30 DIAGNOSIS — K2990 Gastroduodenitis, unspecified, without bleeding: Secondary | ICD-10-CM

## 2011-12-30 MED ORDER — OMEPRAZOLE 20 MG PO CPDR: 20.0000 mg | DELAYED_RELEASE_CAPSULE | Freq: Every day | ORAL | Status: DC

## 2011-12-30 NOTE — Progress Notes (Signed)
Subjective:    Patient ID: Roy Huber, male    DOB: 18-Dec-1985, 26 y.o.   MRN: [id number]  HPI Here for f/u of gastritis  Overall no problems with prilosec and it is working very well  No more blood in stool at all   Did cut back on spicy food - but not gone  Still drinking caffeine  Pain is random-not often at all  ? If rel to what he eats Needs to eat better  Rev  Labs- all normal   Wants to stay on the prilosec  Patient Active Problem List  Diagnosis  . ANXIETY DISORDER  . 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 SCROTAL VARICES  . Exposure to communicable disease  . Palpitation  . Routine general medical examination at a health care facility  . Plantar fasciitis  . Angioma  . Left elbow pain  . Foot pain  . Gastritis  . Epigastric abdominal pain   Past Medical History  Diagnosis Date  . Anxiety     prev was paxil, w/o good response 2012   No past surgical history on file. History  Substance Use Topics  . Smoking status: Never Smoker   . Smokeless tobacco: Never Used  . Alcohol Use: Yes     occ   Family History  Problem Relation Age of Onset  . Anxiety disorder Father   . Thyroid disease Father     not sure if hypo- or hyper-  . Depression Sister    Allergies  Allergen Reactions  . Paroxetine Hcl     nausea   Current Outpatient Prescriptions on File Prior to Visit  Medication Sig Dispense Refill  . citalopram (CELEXA) 20 MG tablet Take 1 tablet (20 mg total) by mouth daily.  30 tablet  5  . Multiple Vitamin (MULTIVITAMIN) tablet Take 1 tablet by mouth daily.      Marland Kitchen omeprazole (PRILOSEC) 20 MG capsule Take 1 capsule (20 mg total) by mouth daily.  30 capsule  3      Review of Systems Review of Systems  Constitutional: Negative for fever, appetite change, fatigue and unexpected weight change.  Eyes: Negative for pain and visual disturbance.  Respiratory: Negative for cough and shortness of breath.   Cardiovascular: Negative for cp  or palpitations    Gastrointestinal: Negative for nausea, diarrhea and constipation. neg for abd pain or blood in stool or dark stools Genitourinary: Negative for urgency and frequency.  Skin: Negative for pallor or rash   Neurological: Negative for weakness, light-headedness, numbness and headaches.  Hematological: Negative for adenopathy. Does not bruise/bleed easily.  Psychiatric/Behavioral: Negative for dysphoric mood. The patient is not nervous/anxious.         Objective:   Physical Exam  Constitutional: He appears well-developed and well-nourished. No distress.  HENT:  Head: Normocephalic and atraumatic.  Mouth/Throat: Oropharynx is clear and moist.  Eyes: Conjunctivae and EOM are normal. Pupils are equal, round, and reactive to light. No scleral icterus.  Neck: Normal range of motion. Neck supple. No thyromegaly present.  Cardiovascular: Normal rate and regular rhythm.   Pulmonary/Chest: Effort normal and breath sounds normal.  Abdominal: Soft. Bowel sounds are normal. He exhibits no distension and no mass. There is no tenderness. There is no rebound and no guarding.  Lymphadenopathy:    He has no cervical adenopathy.  Neurological: He is alert.  Skin: Skin is warm and dry. No pallor.  Psychiatric: He has a normal mood and affect.  Assessment & Plan:

## 2011-12-30 NOTE — Patient Instructions (Signed)
I'm glad you are doing better  Continue the omeprazole  Watch diet carefully for fatty/ spicy foods/ caffeine  If symptoms worsen let me know

## 2011-12-30 NOTE — Assessment & Plan Note (Signed)
Much improved with PPI  Stressed imp of better diet Reassuring lab and exam Will continue omeprazole at this time

## 2012-04-30 ENCOUNTER — Ambulatory Visit (INDEPENDENT_AMBULATORY_CARE_PROVIDER_SITE_OTHER): Payer: BC Managed Care – PPO | Admitting: Family Medicine

## 2012-04-30 ENCOUNTER — Encounter: Payer: Self-pay | Admitting: Family Medicine

## 2012-04-30 VITALS — BP 124/70 | HR 68 | Temp 97.7°F | Wt 199.8 lb

## 2012-04-30 DIAGNOSIS — R221 Localized swelling, mass and lump, neck: Secondary | ICD-10-CM | POA: Insufficient documentation

## 2012-04-30 DIAGNOSIS — J029 Acute pharyngitis, unspecified: Secondary | ICD-10-CM

## 2012-04-30 DIAGNOSIS — K625 Hemorrhage of anus and rectum: Secondary | ICD-10-CM

## 2012-04-30 MED ORDER — HYDROCORTISONE ACETATE 25 MG RE SUPP: 25.0000 mg | Freq: Two times a day (BID) | RECTAL | Status: DC | PRN

## 2012-04-30 NOTE — Patient Instructions (Signed)
You have pharyngitis, possibly viral.  We will check throat for strep. Push fluids and plenty of rest. May use ibuprofen for throat inflammation. Salt water gargles. Suck on cold things like popsicles or warm things like herbal teas (whichever soothes the throat better). Return if fever >101.5, worsening pain, or trouble opening/closing mouth, or hoarse voice. Good to see you today, call clinic with questions.  For blood in stool - try colace again as well as increase water and fiber in diet.  I will send in anusol suppository to try.  If not improved after a few days let us know.

## 2012-04-30 NOTE — Progress Notes (Signed)
  Subjective:    Patient ID: Roy Huber, male    DOB: 08-Jun-1985, 26 y.o.   MRN: [id number]  HPI CC: ST  2wk h/o ST, feels uvula swollen leading to gagging.  Mild cough present.  Initially felt better, then 2 nights ago felt worse.  ST comes and goes.  Denies fevers/chills, PNdrainage, congestion, HA, abd pain, nausea.  Saw dentist recently for mouth pain - told not gum related.  Noticing bleeding with wiping and in bowl.  Burning pain around bottom.  H/o hemorrhoids.  Colace in past has helped.  Denies constipation.  Not good water/fiber in diet.  Last blood in stool was 2 d ago.  Currently off prilosec.  But this helped in past.  Past Medical History  Diagnosis Date  . Anxiety     prev was paxil, w/o good response 2012     Review of Systems Per HPI    Objective:   Physical Exam  Nursing note and vitals reviewed. Constitutional: He appears well-developed and well-nourished. No distress.  HENT:  Head: Normocephalic and atraumatic.  Right Ear: Hearing, tympanic membrane, external ear and ear canal normal.  Left Ear: Hearing, tympanic membrane, external ear and ear canal normal.  Nose: No mucosal edema or rhinorrhea.  Mouth/Throat: Uvula is midline and mucous membranes are normal. Uvula swelling (mild) present. Posterior oropharyngeal edema and posterior oropharyngeal erythema present. No oropharyngeal exudate or tonsillar abscesses.       Not enlarged tonsils.  Slight tonsillar exudate on right.  Eyes: Conjunctivae normal and EOM are normal. Pupils are equal, round, and reactive to light.  Neck: Normal range of motion. Neck supple.  Cardiovascular: Normal rate, regular rhythm, normal heart sounds and intact distal pulses.   No murmur heard. Pulmonary/Chest: Effort normal and breath sounds normal. No respiratory distress. He has no wheezes. He has no rales.  Genitourinary: Rectal exam shows external hemorrhoid. Rectal exam shows no fissure.       small decompressed ext  hemorrhoid on left  Lymphadenopathy:    He has no cervical adenopathy.      Assessment & Plan:

## 2012-04-30 NOTE — Assessment & Plan Note (Signed)
Anticipate due to benign cause (hemorrhoids). Discussed hemorrhoid care. Treat with anusol hc supp.  Discussed fiber and water in diet.   If sxs continue despite this, recommended seek care again.  Pt agrees.

## 2012-04-30 NOTE — Assessment & Plan Note (Addendum)
Anticipate viral.  1/4 centor criteria (possible exudates).  RST negative. Uvula not erythematous, mildly swollen. Discussed symptomatic relief.

## 2012-05-19 ENCOUNTER — Ambulatory Visit (INDEPENDENT_AMBULATORY_CARE_PROVIDER_SITE_OTHER): Payer: BC Managed Care – PPO | Admitting: Family Medicine

## 2012-05-19 ENCOUNTER — Encounter: Payer: Self-pay | Admitting: Family Medicine

## 2012-05-19 VITALS — BP 138/80 | HR 80 | Temp 98.0°F | Wt 199.0 lb

## 2012-05-19 DIAGNOSIS — R221 Localized swelling, mass and lump, neck: Secondary | ICD-10-CM

## 2012-05-19 DIAGNOSIS — R6889 Other general symptoms and signs: Secondary | ICD-10-CM

## 2012-05-19 MED ORDER — AMOXICILLIN 875 MG PO TABS: 875.0000 mg | ORAL_TABLET | Freq: Two times a day (BID) | ORAL | Status: DC

## 2012-05-19 NOTE — Progress Notes (Signed)
  Subjective:    Patient ID: Roy Huber, male    DOB: 08/30/1985, 26 y.o.   MRN: [id number]  HPI CC: ST  Seen in Saturday clinic (04/30/2012).  Feeling of fullness in throat/neck.  Thought viral pharyngitis, RST negative.  Continues to gag from swollen uvula.  Has been using ibuprofen which only temporarily helps.  No smokers at home. Started with coworker that was sick. No h/o asthma. H/o heartburn- but not recently.  Denies fevers/chills, PNdrainage, congestion, HA, abd pain, nausea. BMs last few days more green.  Father with h/o thyroid disease.  Past Medical History  Diagnosis Date  . Anxiety     prev was paxil, w/o good response 2012    Family History  Problem Relation Age of Onset  . Anxiety disorder Father   . Thyroid disease Father     not sure if hypo- or hyper-  . Depression Sister    Review of Systems Per HPI    Objective:   Physical Exam  Nursing note and vitals reviewed. Constitutional: He appears well-developed and well-nourished. No distress.  HENT:  Head: Normocephalic and atraumatic.  Right Ear: Hearing, tympanic membrane, external ear and ear canal normal.  Left Ear: Hearing, tympanic membrane, external ear and ear canal normal.  Nose: Nose normal. No mucosal edema or rhinorrhea.  Mouth/Throat: Uvula is midline, oropharynx is clear and moist and mucous membranes are normal. Uvula swelling (at superior uvula but no erythema) present. No oropharyngeal exudate, posterior oropharyngeal edema, posterior oropharyngeal erythema or tonsillar abscesses.  Eyes: Conjunctivae normal and EOM are normal. Pupils are equal, round, and reactive to light. No scleral icterus.  Neck: Normal range of motion. Neck supple. No thyromegaly present.  Lymphadenopathy:    He has no cervical adenopathy.       Assessment & Plan:

## 2012-05-19 NOTE — Assessment & Plan Note (Signed)
Ongoing for last month. Will treat as mild simmering uvulitis with amoxicillin for 10 days. If not better, discussed may be GERD related and to start omeprazole for next 2-3 weeks. If not better, consider thyroid ultrasound given "fullness" sensation although no overt thyromegaly on exam. If unrevealing [place], refer to ENT. Pt agrees with plan.

## 2012-05-19 NOTE — Patient Instructions (Addendum)
Your uvula is a bit swollen - we will treat for infection with amoxicillin for 10 days.  If no better after this, take over the counter prilosec 20mg  one pill daily for 2-3 weeks.  If not better, let me know for thyroid ultrasound. If all unrevealing, we will set you up for ENT evaluation.

## 2012-06-02 ENCOUNTER — Encounter: Payer: Self-pay | Admitting: Family Medicine

## 2012-06-02 ENCOUNTER — Ambulatory Visit (INDEPENDENT_AMBULATORY_CARE_PROVIDER_SITE_OTHER): Payer: BC Managed Care – PPO | Admitting: Family Medicine

## 2012-06-02 VITALS — BP 130/80 | HR 94 | Temp 98.5°F | Wt 200.0 lb

## 2012-06-02 DIAGNOSIS — J029 Acute pharyngitis, unspecified: Secondary | ICD-10-CM

## 2012-06-02 DIAGNOSIS — Z23 Encounter for immunization: Secondary | ICD-10-CM

## 2012-06-02 MED ORDER — OMEPRAZOLE 20 MG PO CPDR: 20.0000 mg | DELAYED_RELEASE_CAPSULE | Freq: Two times a day (BID) | ORAL | Status: DC

## 2012-06-02 NOTE — Assessment & Plan Note (Signed)
Overall reassuring exam and no reason to image/refer at this point. This doesn't appear infectious.  This appears to be GERD related. He is some better, I would inc the PPI to BID and elevate the head of bed.  He has diet with high caffeine, soda, etc and it would be reasonable to cut this out.  Discussed and he agrees.  F/u or call back prn.

## 2012-06-02 NOTE — Patient Instructions (Signed)
Take the omeprazole twice a day, elevated the head of your bed and cut back on soda/chococlate/caffeine.  Take care. Glad to see you.

## 2012-06-02 NOTE — Progress Notes (Signed)
Prev notes from 12/13 (x2) reviewed.  Treated with amoxil and he doesn't feel fully improved.  Throat is still irritated.    Had some "white bumps" in throat and his uvula was irritated.  He was worried about the appearance of the epithelium in his OP. He thought his tonsils were enlarged.   He had dental eval that was unremarkable, no dental cause seen for his sx.    Occ with swallowing, he'll get neck pain when he tenses up but this seems to originate in the SCMs and strap muscles per his report, not deeper in the neck, ie in the larynx or esophagus.  Irritation in OP seems to be worse in AM.  On prilosec but just recently.    No fevers.  He hasn't choked.  Nonsmoker.    Meds, vitals, and allergies reviewed.   ROS: See HPI.  Otherwise, noncontributory.  nad ncat Nasal exam unremarkable OP with normal epithelium on detailed exam except for mild irritation in the posterior OP w/o any masses, ulceration, leukoplakia noted Neck supple, no LA no masses rrr ctab

## 2012-08-03 ENCOUNTER — Encounter: Payer: Self-pay | Admitting: Family Medicine

## 2012-08-03 ENCOUNTER — Ambulatory Visit (INDEPENDENT_AMBULATORY_CARE_PROVIDER_SITE_OTHER): Payer: BC Managed Care – PPO | Admitting: Family Medicine

## 2012-08-03 VITALS — BP 122/78 | HR 86 | Temp 97.8°F | Wt 191.5 lb

## 2012-08-03 DIAGNOSIS — N508 Other specified disorders of male genital organs: Secondary | ICD-10-CM

## 2012-08-03 DIAGNOSIS — F411 Generalized anxiety disorder: Secondary | ICD-10-CM

## 2012-08-03 NOTE — Progress Notes (Signed)
His stress level has improved recently; he is leaving [employer] and concurrently the throat symptoms improved.  He's down to 1 pilosec a day.  He's going back to [employer], with the plan to work up through management.  He's happy about this.  He wanted to come in since he is changing jobs and will have a potential coverage gap.    He wanted to discuss his anxiety level and he was worried about his cardiac stand point- this is not a new concern for him.  He had stopped citalopram in the meantime. He is optimistic with the job change.  No Si/Hi.  He contracts for safety.   When he lays down, he'll occ feel a skipped beat.  He'll occ get L sided chest pain, less prominent or frequent now compared to prev.  It is variable, can occ happen at rest or exercise.  He can exercise w/o symptoms.  No syncope.    Back in high school he had a lump on a testicle.  He saw an outside clinic about this prev.  He had an ultrasound done at the time; he didn't recall sig sx/pathology.  It hasn't changed in years. It isn't painful.    Meds, vitals, and allergies reviewed.   ROS: See HPI.  Otherwise, noncontributory.  GEN: nad, alert and oriented HEENT: mucous membranes moist NECK: supple w/o LA CV: rrr.  no murmur PULM: ctab, no inc wob ABD: soft, +bs EXT: no edema SKIN: no acute rash Testes bilaterally descended without nodularity, tenderness.  L testicle with small nontender mass that is mobile on the superior aspect.  No scrotal masses or lesions o/w. No penis lesions or urethral discharge.

## 2012-08-03 NOTE — Patient Instructions (Addendum)
If the anxiety gets worse, the call the clinic.  Good luck and congrats with the job change.  Your testicle exam is very reassuring and I don't suspect a problem.  Your heart sounds fine.  Take care.

## 2012-08-03 NOTE — Assessment & Plan Note (Signed)
>  25 min spent with face to face with patient, >50% counseling and/or coordinating care  His jog situation will improve and it is reasonable to stay off SSRI for now.  If anxiety increases, he'll call and we'll discuss the citalopram restart, etc.  He agrees. Contracts for safety.    His cardiac exam is totally normal and I wouldn't do anything other than continue healthy habits.  I reassured patient.

## 2012-08-03 NOTE — Assessment & Plan Note (Signed)
Prev with u/s done and no change in lesion per patient in years.  He doesn't recall the site of the u/s to get records.  This is very highly likely to be totally unremarkable lesion given the exam, duration, and lack to change.  F/u prn.  Reassured patient.

## 2013-05-01 ENCOUNTER — Ambulatory Visit (INDEPENDENT_AMBULATORY_CARE_PROVIDER_SITE_OTHER): Payer: BC Managed Care – PPO | Admitting: Family Medicine

## 2013-05-01 VITALS — BP 122/80 | HR 57 | Temp 98.1°F | Wt 183.0 lb

## 2013-05-01 DIAGNOSIS — J069 Acute upper respiratory infection, unspecified: Secondary | ICD-10-CM

## 2013-05-01 LAB — POCT RAPID STREP A (OFFICE): Rapid Strep A Screen: NEGATIVE

## 2013-05-01 NOTE — Assessment & Plan Note (Signed)
RST neg Disc symptomatic care - see instructions on AVS  Given handout  Update if not starting to improve in a week or if worsening   Reassuring exam

## 2013-05-01 NOTE — Progress Notes (Signed)
Pre visit review using our clinic review tool, if applicable. No additional management support is needed unless otherwise documented below in the visit note. 

## 2013-05-01 NOTE — Progress Notes (Signed)
Subjective:    Patient ID: Roy Huber, male    DOB: April 22, 1986, 27 y.o.   MRN: [id number]  HPI Here for st and uri symptoms Exp to strep at work Neg RST today  Hoarse voice with some nasal cong Dry cough worse at night No fever/ chills or body aches No rash  Patient Active Problem List   Diagnosis Date Noted  . Viral URI with cough 05/01/2013  . Mass of testicle 08/03/2012  . BRBPR (bright red blood per rectum) 04/30/2012  . Gastritis 12/09/2011  . Foot pain 11/03/2011  . Plantar fasciitis 03/30/2011  . 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 Left elbow pain 03/30/2011  . Routine general medical examination at a health care facility 03/22/2011  . Palpitation 12/12/2010  . Exposure to communicable disease 09/01/2010  . INTERNAL HEMORRHOIDS WITHOUT MENTION COMP 03/12/2009  . 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 ANXIETY DISORDER 02/07/2009   Past Medical History  Diagnosis Date  . Anxiety     prev with paxil, w/o good response 2012   No past surgical history on file. History  Substance Use Topics  . Smoking status: Never Smoker   . Smokeless tobacco: Never Used  . Alcohol Use: Yes     Comment: occ   Family History  Problem Relation Age of Onset  . Anxiety disorder Father   . Thyroid disease Father     not sure if hypo- or hyper-  . Depression Sister    Allergies  Allergen Reactions  . Paroxetine Hcl     nausea   Current Outpatient Prescriptions on File Prior to Visit  Medication Sig Dispense Refill  . omeprazole (PRILOSEC) 20 MG capsule Take 20 mg by mouth daily.       No current facility-administered medications on file prior to visit.    Review of Systems Review of Systems  Constitutional: Negative for fever, appetite change,  and unexpected weight change. neg for body aches and pos for fatigue ENT pos for congestion/ st and neg for sinus pain  Eyes: Negative for pain and visual disturbance.  Respiratory: Negative for wheeze and shortness of breath.     Cardiovascular: Negative for cp or palpitations    Gastrointestinal: Negative for nausea, diarrhea and constipation.  Genitourinary: Negative for urgency and frequency.  Skin: Negative for pallor or rash   Neurological: Negative for weakness, light-headedness, numbness and headaches.  Hematological: Negative for adenopathy. Does not bruise/bleed easily.  Psychiatric/Behavioral: Negative for dysphoric mood. The patient is not nervous/anxious.         Objective:   Physical Exam  Constitutional: He appears well-developed and well-nourished. No distress.  HENT:  Head: Normocephalic and atraumatic.  Right Ear: External ear normal.  Left Ear: External ear normal.  Mouth/Throat: Oropharynx is clear and moist. No oropharyngeal exudate.  Nares are injected and congested  No sinus tenderness   Clear rhinorrhea and post nasal drip   Eyes: Conjunctivae and EOM are normal. Pupils are equal, round, and reactive to light. Right eye exhibits no discharge. Left eye exhibits no discharge. No scleral icterus.  Neck: Normal range of motion. Neck supple. No JVD present. No thyromegaly present.  Cardiovascular: Normal rate and regular rhythm.   Pulmonary/Chest: Effort normal and breath sounds normal. No respiratory distress. He has no wheezes. He has no rales.  Lymphadenopathy:    He has no cervical adenopathy.  Neurological: He is alert.  Skin: Skin is warm and dry. No rash noted.  Psychiatric: He has a normal mood and  affect.          Assessment & Plan:

## 2013-05-01 NOTE — Patient Instructions (Signed)
I think you have a viral upper respiratory infection / cold with cough and sore throat  Drink lots of water and get extra rest  Tylenol will help sore throat or fever if you develop one  Salt water gargles and chloraseptic throat spray are helpful for sore throat  For cough - I recommend mucinex DM over the counter  For hoarseness - rest your voice as much as possible  Rapid strep test is negative today  Update if not starting to improve in a week or if worsening

## 2013-08-02 ENCOUNTER — Encounter: Payer: Self-pay | Admitting: Family Medicine

## 2013-08-02 ENCOUNTER — Ambulatory Visit (INDEPENDENT_AMBULATORY_CARE_PROVIDER_SITE_OTHER): Payer: BC Managed Care – PPO | Admitting: Family Medicine

## 2013-08-02 VITALS — BP 106/66 | HR 66 | Temp 97.8°F | Wt 188.8 lb

## 2013-08-02 DIAGNOSIS — R21 Rash and other nonspecific skin eruption: Secondary | ICD-10-CM

## 2013-08-02 DIAGNOSIS — F411 Generalized anxiety disorder: Secondary | ICD-10-CM

## 2013-08-02 MED ORDER — HYDROCORTISONE 1 % EX CREA: 1.0000 "application " | TOPICAL_CREAM | Freq: Three times a day (TID) | CUTANEOUS | Status: DC

## 2013-08-02 NOTE — Progress Notes (Signed)
Pre visit review using our clinic review tool, if applicable. No additional management support is needed unless otherwise documented below in the visit note.  His GERD improved after leaving [employer].    Rash on hands. Noted at work, after changing to a [employer] that had a new soap.  No other skin lesion, just on the hands, bilaterally, dorsum > palms.  Worse on work days.  Better when not at work.  He didn't have to use new gloves.  Doesn't itch, but gets red.  Some drying between the digits in the web spaces.    Anxiety.  It comes in waves.  He was better after the job change. He is engaged now and that is good, "but it's something else to worry about" ie planning a wedding.  He'll still feel an occ skipped beat.  No syncope.  He was afraid to exercise, due to worry.  This was a recurrent issue for him.  He feels better when active and distracted, ie during a softball game. No illicits.   Meds, vitals, and allergies reviewed.   ROS: See HPI.  Otherwise, noncontributory.  GEN: nad, alert and oriented HEENT: mucous membranes moist NECK: supple w/o LA CV: rrr.  no murmur PULM: ctab, no inc wob EXT: no edema SKIN: no acute except for redness on the dorsum of the hands B with dry skin between the digits in the web spaces.  Clearly demarcated at the wrist.  No palmar lesions.

## 2013-08-02 NOTE — Assessment & Plan Note (Signed)
Job improved, now with anxiety related to planning a wedding.  Normal cardiac exam, would have him ease back into exercising.  I reviewed his old 6.  Neither parent had early CAD.  He looks to be very low risk.  D/w pt about managing anxiety.  Instead of planning the entire wedding, I asked him to talk to his fiancee about what kind of day they want.  That may help with the decision making progress.  He agreed.  He'll update me as needed.  He doesn't appear to be in need of meds/tx at this point.

## 2013-08-02 NOTE — Assessment & Plan Note (Signed)
Looks to be due to soap at work.  Use OTC hydrocortisone for now and change soaps at work- see work note.  D/w pt.  He'll update me.

## 2013-08-02 NOTE — Patient Instructions (Signed)
Your heart sounds good.  Gradually work back into exercise.  Try OTC hydrocortisone cream 1%.  Use it 2-3 times a day on your hands.  Ask work about using a different soap.  Update me in a few days.  Congrats on your engagement.

## 2013-10-05 ENCOUNTER — Ambulatory Visit (INDEPENDENT_AMBULATORY_CARE_PROVIDER_SITE_OTHER): Payer: BC Managed Care – PPO | Admitting: Family Medicine

## 2013-10-05 ENCOUNTER — Encounter: Payer: Self-pay | Admitting: Family Medicine

## 2013-10-05 VITALS — BP 98/64 | HR 54 | Temp 97.8°F | Ht 74.0 in | Wt 191.8 lb

## 2013-10-05 DIAGNOSIS — Z8349 Family history of other endocrine, nutritional and metabolic diseases: Secondary | ICD-10-CM

## 2013-10-05 DIAGNOSIS — Z0000 Encounter for general adult medical examination without abnormal findings: Secondary | ICD-10-CM

## 2013-10-05 DIAGNOSIS — Z131 Encounter for screening for diabetes mellitus: Secondary | ICD-10-CM

## 2013-10-05 LAB — GLUCOSE, RANDOM: GLUCOSE: 82 mg/dL (ref 70–99)

## 2013-10-05 LAB — TSH: TSH: 0.37 u[IU]/mL (ref 0.35–4.50)

## 2013-10-05 NOTE — Patient Instructions (Signed)
Go to the lab on the way out.  We'll contact you with your lab report. 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 It isn't a problem. It may or may not come back.  Use the back stretches.  Take care. Glad to see you.

## 2013-10-05 NOTE — Progress Notes (Signed)
Pre visit review using our clinic review tool, if applicable. No additional management support is needed unless otherwise documented below in the visit note.  CPE- See plan.  Routine anticipatory guidance given to patient.  See health maintenance. Tetanus 2010 Flu shot prev done.   Colon and prostate cancer screening not due.  FH thyroid disease. D/w pt about checking a TSH.  No neck mass.  Due for DM2 screening.  Lipids prev wnl.   Living will d/w pt.  Levada Dy his fiancee would be designated if patient were incapacitated.   Diet and exercise- d/w pt.  Softball for exercise.  "Diet is terrible." discussed both.    Hand sx are better.  He didn't need to use the steroid cream. He limited that specific soap exposure.    Anxiety level much improved with his job change.  Doing well at work.    Xiphoid tender after the dog hit him.  D/w pt.    Was working under the sink and then got lightheaded.  This is resolved.  No other related sx.  No syncope.    Spots on his tongue.  Resolved now.  Had a pic on his phone.  Looked like geographic tongue.  Didn't burn.  No trauma.    Skin spots.  Brown macule on R side of face, not changed in years.   Spot behind R ear. Fleshy minimally raised papule w/o ulceration.  Looks benign.  Spot on R calf.  H/o wart in the area years ago.  Could be scar tissue.  Not getting bigger.    L lower back, paraspinal tightness. Episodic flares.  Worse with a long L stride.  Rare L sided radicular sx.  Going on since he was in high school.    PMH and SH reviewed  Meds, vitals, and allergies reviewed.   ROS: See HPI.  Otherwise negative.    GEN: nad, alert and oriented HEENT: mucous membranes moist, OP wnl, TM and nasal exam wnl, tongue wnl NECK: supple w/o LA, no TMG CV: rrr. PULM: ctab, no inc wob ABD: soft, +bs EXT: no edema Back not ttp. SLR neg. Distally nv intact SKIN: Brown macule on R side of face, not changed in years per patient, appears benign  Spot  behind R ear. Fleshy minimally raised papule w/o ulceration. Looks benign.  Spot on R calf. H/o wart in the area years ago. Could be scar tissue. Not getting bigger.  Looks benign Cherry angiomas noted.

## 2013-10-06 ENCOUNTER — Encounter: Payer: Self-pay | Admitting: *Deleted

## 2013-10-06 NOTE — Assessment & Plan Note (Addendum)
Routine anticipatory guidance given to patient.  See health maintenance. Tetanus 2010 Flu shot prev done.   Colon and prostate cancer screening not due.  FH thyroid disease. D/w pt about checking a TSH.  No neck mass.  Due for DM2 screening.  Lipids prev wnl.   Living will d/w pt.  Levada Dy his fiancee would be designated if patient were incapacitated.   Diet and exercise- d/w pt.  Softball for exercise.  "Diet is terrible." discussed both.   He isn't orthostatic today, the chest sx at the xiphoid should resolve, and his skin lesions look benign.  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  D/w pt about back exercises for a likely intermittent lumbar strain.

## 2014-03-22 ENCOUNTER — Encounter: Payer: Self-pay | Admitting: Family Medicine

## 2014-03-22 ENCOUNTER — Ambulatory Visit (INDEPENDENT_AMBULATORY_CARE_PROVIDER_SITE_OTHER): Payer: BC Managed Care – PPO | Admitting: Family Medicine

## 2014-03-22 VITALS — BP 116/70 | HR 59 | Temp 97.3°F | Wt 202.8 lb

## 2014-03-22 DIAGNOSIS — H8110 Benign paroxysmal vertigo, unspecified ear: Secondary | ICD-10-CM | POA: Insufficient documentation

## 2014-03-22 DIAGNOSIS — Z23 Encounter for immunization: Secondary | ICD-10-CM

## 2014-03-22 NOTE — Patient Instructions (Signed)
Take meclizine 12.5mg  a day if needed for the vertigo (BPV). Use the bedside exercise and this should get better.  Take care.

## 2014-03-22 NOTE — Progress Notes (Signed)
Pre visit review using our clinic review tool, if applicable. No additional management support is needed unless otherwise documented below in the visit note.  Dizzy sensation.  Had been constant initially (about 1 week ago), then episodic in the interval.  First noted after getting up.  No presyncope.  Nearly feels like the room spinning.  Noted with some position changes occ.  He can have sx with head movement but not eye tracking.    He had some L sided chest pain, he has been lifting kegs at work.  This has been going on intermittently for a long period of time.   Meds, vitals, and allergies reviewed.   ROS: See HPI.  Otherwise, noncontributory.  GEN: nad, alert and oriented HEENT: mucous membranes moist, TM wnl, canal with some cerumen noted. Nasal exam stuffy (at baseline per patient) NECK: supple w/o LA CV: rrr.  no murmur, chest wall not ttp PULM: ctab, no inc wob ABD: soft, +bs EXT: no edema SKIN: no acute rash DHP slightly positive on exam.  CN 2-12 wnl B, S/S/DTR wnl x4

## 2014-03-22 NOTE — Assessment & Plan Note (Signed)
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  Path/phys d/w pt.  Improving. No alarming sx.  The chest pain looks to be MSK in origin and wouldn't need w/u.  D/w pt about bedside exercises and prn meclizine for vertigo.  F/u prn.  He agrees.

## 2014-04-05 ENCOUNTER — Encounter: Payer: Self-pay | Admitting: Family Medicine

## 2014-04-05 ENCOUNTER — Ambulatory Visit (INDEPENDENT_AMBULATORY_CARE_PROVIDER_SITE_OTHER): Payer: BC Managed Care – PPO | Admitting: Family Medicine

## 2014-04-05 VITALS — BP 110/78 | HR 63 | Temp 98.5°F | Wt 202.8 lb

## 2014-04-05 DIAGNOSIS — K625 Hemorrhage of anus and rectum: Secondary | ICD-10-CM | POA: Insufficient documentation

## 2014-04-05 DIAGNOSIS — H8110 Benign paroxysmal vertigo, unspecified ear: Secondary | ICD-10-CM

## 2014-04-05 MED ORDER — DOCUSATE SODIUM 100 MG PO CAPS: 100.0000 mg | ORAL_CAPSULE | Freq: Every day | ORAL | Status: DC

## 2014-04-05 MED ORDER — HYDROCORTISONE ACETATE 25 MG RE SUPP: 25.0000 mg | Freq: Two times a day (BID) | RECTAL | Status: DC

## 2014-04-05 NOTE — Patient Instructions (Signed)
Use the suppositories and then start colace once a day.  Try not to strain.  The vertigo may come back at some point, but it usually isn't frequent.  Take care.

## 2014-04-05 NOTE — Assessment & Plan Note (Signed)
Given his age and lack of abd sx and no other alarming sx, and since he has a h/o straining, and a h/o relief with suppositories, would presume internal hemorrhoids.  D/w pt.  Would use suppositories, avoid straining, add on colace and f/u prn.  He agrees.

## 2014-04-05 NOTE — Progress Notes (Signed)
Pre visit review using our clinic review tool, if applicable. No additional management support is needed unless otherwise documented below in the visit note.  Vertigo got worse the day after the last OV on 03/22/14.  Then since then it as gotten better.  He was able to work on 03/24/14.  He has had "moments" with mild sxbut it has been better overall.   Passed some blood 03/23/14.  H/o episodic sx over the years.  BRBPR.  With BMs. Not painful.  No FCNAVD.  Occ hard stools but better recently.   Occ has to strain with BMs.  No abd pain "but my stomach will get nervous" and some of this seems to be anxiety related.   He had used steroid suppositories in distant past, with relief, but not recently.    Meds, vitals, and allergies reviewed.   ROS: See HPI.  Otherwise, noncontributory.  nad ncat Mmm rrr ctab Abd soft, not ttp External rectal exam wnl, no gross blood, no fissure, no external hemorroid

## 2014-04-05 NOTE — Assessment & Plan Note (Signed)
Improved, reassured patient. Continue prn meclizine.

## 2014-08-08 ENCOUNTER — Ambulatory Visit (INDEPENDENT_AMBULATORY_CARE_PROVIDER_SITE_OTHER): Payer: BLUE CROSS/BLUE SHIELD | Admitting: Family Medicine

## 2014-08-08 ENCOUNTER — Encounter: Payer: Self-pay | Admitting: Family Medicine

## 2014-08-08 VITALS — BP 112/72 | HR 72 | Temp 98.6°F | Wt 205.8 lb

## 2014-08-08 DIAGNOSIS — M79606 Pain in leg, unspecified: Secondary | ICD-10-CM

## 2014-08-08 DIAGNOSIS — L989 Disorder of the skin and subcutaneous tissue, unspecified: Secondary | ICD-10-CM

## 2014-08-08 NOTE — Progress Notes (Signed)
Pre visit review using our clinic review tool, if applicable. No additional management support is needed unless otherwise documented below in the visit note.  Lesion on R calf present for at least a decade, raised slightly.  No draining.  Not painful.  He had it looked at prev, years ago.  No other h/o atypical moles.    Knee pain.  Not acute.  L medial pain, just proximal the joint line. Likely going on intermittently for years.  "I can feel something move when I push on it."  No pain walking.  Sore when working out.  No specific injury.  No locking, no popping.  No bruising.    Meds, vitals, and allergies reviewed.   ROS: See HPI.  Otherwise, noncontributory.  nad Lesion on R calf, 69mm, superficial, not irritated.  Looks to be old scar tissue, no ulceration.  L knee with normal ROM, not puffy or ttp on the joint line, no bruising.  Slightly ttp at the distal insertion of the adductor.

## 2014-08-08 NOTE — Patient Instructions (Signed)
Likely an adductor strain.  Stretch and use ice if needed.  The skin spot looks benign.  If it changes, then let me know.

## 2014-08-10 DIAGNOSIS — L989 Disorder of the skin and subcutaneous tissue, unspecified: Secondary | ICD-10-CM | POA: Insufficient documentation

## 2014-08-10 DIAGNOSIS — M79606 Pain in leg, unspecified: Secondary | ICD-10-CM | POA: Insufficient documentation

## 2014-08-10 NOTE — Assessment & Plan Note (Signed)
Looks to be benign, reassured, notify me if changing.  He agrees.

## 2014-08-10 NOTE — Assessment & Plan Note (Signed)
likely adductor strain, d/w pt about stretching and f/u prn.  No actual knee findings.

## 2015-01-18 ENCOUNTER — Ambulatory Visit (INDEPENDENT_AMBULATORY_CARE_PROVIDER_SITE_OTHER): Payer: BLUE CROSS/BLUE SHIELD | Admitting: Family Medicine

## 2015-01-18 ENCOUNTER — Encounter: Payer: Self-pay | Admitting: Family Medicine

## 2015-01-18 VITALS — BP 118/64 | HR 57 | Temp 98.1°F | Wt 209.0 lb

## 2015-01-18 DIAGNOSIS — M791 Myalgia, unspecified site: Secondary | ICD-10-CM

## 2015-01-18 NOTE — Progress Notes (Signed)
Pre visit review using our clinic review tool, if applicable. No additional management support is needed unless otherwise documented below in the visit note.  Upper L>R back pain, medial to the scapula.  Going on for about 2 months.  More noted with back extension.  Noted in the AM, better as the day goes on.  No exertional sx.  No CP, SOB, BLE edema.  No arm weakness.    He just interviewed for a job.  His wedding is in 2 weeks.    Meds, vitals, and allergies reviewed.   ROS: See HPI.  Otherwise, noncontributory.  nad ncat Neck supple, no LA rrr ctab Upper mid back, medial to B scapula- ttp.  No rash.  Midline not ttp No bruise, no rash.

## 2015-01-18 NOTE — Patient Instructions (Signed)
Likely muscle strain.  Try tylenol or ibuprofen (with food). Heat or ice, gently stretch.  Should get better.  Take care.

## 2015-01-20 DIAGNOSIS — M791 Myalgia, unspecified site: Secondary | ICD-10-CM | POA: Insufficient documentation

## 2015-01-20 NOTE — Assessment & Plan Note (Signed)
Likely a benign strain.  Try tylenol or ibuprofen (with food).  Heat or ice, gently stretch.  Should get better gradually.  F/u prn.

## 2015-02-24 ENCOUNTER — Other Ambulatory Visit: Payer: Self-pay | Admitting: Family Medicine

## 2015-02-24 DIAGNOSIS — Z13220 Encounter for screening for lipoid disorders: Secondary | ICD-10-CM

## 2015-02-24 DIAGNOSIS — Z131 Encounter for screening for diabetes mellitus: Secondary | ICD-10-CM

## 2015-02-25 ENCOUNTER — Other Ambulatory Visit: Payer: BLUE CROSS/BLUE SHIELD

## 2015-02-26 ENCOUNTER — Encounter: Payer: BLUE CROSS/BLUE SHIELD | Admitting: Family Medicine

## 2015-05-03 ENCOUNTER — Other Ambulatory Visit (INDEPENDENT_AMBULATORY_CARE_PROVIDER_SITE_OTHER): Payer: BLUE CROSS/BLUE SHIELD

## 2015-05-03 DIAGNOSIS — Z131 Encounter for screening for diabetes mellitus: Secondary | ICD-10-CM

## 2015-05-03 DIAGNOSIS — Z13220 Encounter for screening for lipoid disorders: Secondary | ICD-10-CM | POA: Diagnosis not present

## 2015-05-03 LAB — LIPID PANEL
CHOL/HDL RATIO: 3
CHOLESTEROL: 167 mg/dL (ref 0–200)
HDL: 52.6 mg/dL (ref >39.00)
LDL CALC: 106 mg/dL — AB (ref 0–99)
NonHDL: 114.42
TRIGLYCERIDES: 43 mg/dL (ref 0.0–149.0)
VLDL: 8.6 mg/dL (ref 0.0–40.0)

## 2015-05-03 LAB — GLUCOSE, RANDOM: Glucose, Bld: 89 mg/dL (ref 70–99)

## 2015-05-06 ENCOUNTER — Encounter: Payer: Self-pay | Admitting: Family Medicine

## 2015-05-06 ENCOUNTER — Ambulatory Visit (INDEPENDENT_AMBULATORY_CARE_PROVIDER_SITE_OTHER): Payer: BLUE CROSS/BLUE SHIELD | Admitting: Family Medicine

## 2015-05-06 VITALS — BP 104/72 | HR 64 | Temp 98.6°F | Ht 74.0 in | Wt 210.5 lb

## 2015-05-06 DIAGNOSIS — Z23 Encounter for immunization: Secondary | ICD-10-CM | POA: Diagnosis not present

## 2015-05-06 DIAGNOSIS — Z0000 Encounter for general adult medical examination without abnormal findings: Secondary | ICD-10-CM | POA: Diagnosis not present

## 2015-05-06 DIAGNOSIS — L989 Disorder of the skin and subcutaneous tissue, unspecified: Secondary | ICD-10-CM

## 2015-05-06 DIAGNOSIS — Z7189 Other specified counseling: Secondary | ICD-10-CM

## 2015-05-06 DIAGNOSIS — M79644 Pain in right finger(s): Secondary | ICD-10-CM

## 2015-05-06 DIAGNOSIS — F411 Generalized anxiety disorder: Secondary | ICD-10-CM

## 2015-05-06 MED ORDER — CITALOPRAM HYDROBROMIDE 20 MG PO TABS: 20.0000 mg | ORAL_TABLET | Freq: Every day | ORAL | Status: DC

## 2015-05-06 NOTE — Assessment & Plan Note (Signed)
Restart celexa and update me as needed, routine cautions d/w pt.   Okay for outpatient f/u.

## 2015-05-06 NOTE — Progress Notes (Signed)
Pre visit review using our clinic review tool, if applicable. No additional management support is needed unless otherwise documented below in the visit note.  CPE- See plan.  Routine anticipatory guidance given to patient.  See health maintenance. Tetanus 2010 Flu 2016 PNA and shingles shot not due.   Colon and prostate cancer screening not due.   Living will d/w pt.  Wife designated if patient were incapacitated.   Diet and exercise d/w pt.  "I'm getting better on both."  Has been back at the gym, usually going 4 days a week.  Diet is good.  HIV screening prev neg.   Anxiety.  Recently married.  Busy with work.  Most bothered at work.  He'll get to the point of vomiting from anxiety, when he has higher stress episodes.  He worries about his health overall.  No SI/HI.  He wanted another trial of citalopram. He is exercising.  Sleeping well, accounting for his work shifts.  He tolerated celexa better than paxil prev.   Skin spots behind R ear and posterior R leg, longstanding, wanted checked again.  No changes per patient.     R thumb pain was caught in a chair about 2 months ago.  Sudden pain, some swelling, at the MCP.  Improved in the meantime, normal ROM but some pain with throwing.  No other finger pain.    PMH and SH reviewed  Meds, vitals, and allergies reviewed.   ROS: See HPI.  Otherwise negative.    GEN: nad, alert and oriented HEENT: mucous membranes moist NECK: supple w/o LA CV: rrr. PULM: ctab, no inc wob ABD: soft, +bs EXT: no edema SKIN: no acute rash but 104mm benign appearing macule behind R pinna.  Not irritated.  Old lesion on R calf noted, appears to be old scar tissue, slightly less than 1cm diameter.  No ulceration.  Benign appearing lesion.   R hand with normal inspection.   R thumb ext tendonitis noted on exam.  No full tendon deficit, distally NV intact. ROM R thumb wnl except for slight dec in full flexion due to some discomfort.

## 2015-05-06 NOTE — Patient Instructions (Signed)
Use the splint and that should help.  Restart celexa and update me if not better.  Keep exercising.  Take care.  Glad to see you.

## 2015-05-06 NOTE — Assessment & Plan Note (Signed)
No need to image given the exam and timeline, d/w pt.  Likely a slowly resolving tendonitis, can use otc thumb spica splint.  Update me as needed.  He agrees.

## 2015-05-06 NOTE — Assessment & Plan Note (Signed)
Tetanus 2010 Flu 2016 PNA and shingles shot not due.   Colon and prostate cancer screening not due.   Living will d/w pt.  Wife designated if patient were incapacitated.   Diet and exercise d/w pt.  "I'm getting better on both."  Has been back at the gym, usually going 4 days a week.  Diet is good.  HIV screening prev neg.

## 2015-05-06 NOTE — Assessment & Plan Note (Signed)
Both benign appearing, reassured.  He agrees. Can recheck periodically, or if changing.

## 2015-05-30 ENCOUNTER — Ambulatory Visit (INDEPENDENT_AMBULATORY_CARE_PROVIDER_SITE_OTHER): Payer: BLUE CROSS/BLUE SHIELD | Admitting: Family Medicine

## 2015-05-30 ENCOUNTER — Encounter: Payer: Self-pay | Admitting: Family Medicine

## 2015-05-30 VITALS — BP 118/70 | HR 50 | Temp 98.4°F | Wt 204.5 lb

## 2015-05-30 DIAGNOSIS — F411 Generalized anxiety disorder: Secondary | ICD-10-CM

## 2015-05-30 DIAGNOSIS — F4322 Adjustment disorder with anxiety: Secondary | ICD-10-CM

## 2015-05-30 MED ORDER — CITALOPRAM HYDROBROMIDE 40 MG PO TABS: 40.0000 mg | ORAL_TABLET | Freq: Every day | ORAL | Status: DC

## 2015-05-30 MED ORDER — HYDROXYZINE HCL 10 MG PO TABS: 5.0000 mg | ORAL_TABLET | Freq: Three times a day (TID) | ORAL | Status: DC | PRN

## 2015-05-30 NOTE — Assessment & Plan Note (Signed)
He'll check with his boss about avoid closing at work for now.  I would increase the citalopram to 40mg  a day.  Can take hydroxyzine as needed for anxiety. He may only need a half dose.  D/w pt, re use and routine cautions.  Okay for outpatient f/u.  Refer for counseling.   He agrees.  >25 minutes spent in face to face time with patient, >50% spent in counselling or coordination of care.

## 2015-05-30 NOTE — Patient Instructions (Signed)
I would increase the citalopram to 40mg  a day.  Take hydroxyzine as needed for anxiety.  You may only need a half dose.  Roy Huber will call about your referral. See her on the way out.  Take care.  Glad to see you.  Update me in about 1-2 weeks.

## 2015-05-30 NOTE — Progress Notes (Signed)
Pre visit review using our clinic review tool, if applicable. No additional management support is needed unless otherwise documented below in the visit note.  Prev note reviewed, re: restart celexa.  Has been on about 3 weeks.  Since then work remains his biggest stressor.  He is working some late at night, ie closing.  Those seems to be the hardest shifts.  He had a trough interaction with an irate and inappropriate customer recently during a late shift.  The cops had to get called because the customer was so out of line.  He had been more worried since then, clearly worse with the later shifts.  He's had to leave early a few times, since he was vomiting from the anxiety.  He'll have the sensation of feeling diffusely hot, feeling the room close in.  He now has anticipatory anxiety, ie worrying about upcoming shifts.  Now he is worried about losing his job, and his worries escalate from there.   He has talked to his boss.  He isn't having to work closing shifts now.    Meds, vitals, and allergies reviewed.   ROS: See HPI.  Otherwise, noncontributory.  nad ncat Mmm OP wnl Neck supple, no LA rrr ctab He appears slightly anxious, but is able to converse normally.  Insight still good.  No SI/HI.

## 2015-06-03 ENCOUNTER — Telehealth: Payer: Self-pay

## 2015-06-03 NOTE — Telephone Encounter (Signed)
Pt was seen 05/30/15; pt needs note stating that he cannot close at work from 06/17/15 thru 07/14/15; pt request note faxed to [phone]. Pt request cb when done.

## 2015-06-04 NOTE — Telephone Encounter (Signed)
Letter done.  Thanks.  Please make sure it printed and send.

## 2015-06-04 NOTE — Telephone Encounter (Signed)
Note printed, signed and faxed as requested.

## 2015-06-12 ENCOUNTER — Ambulatory Visit (INDEPENDENT_AMBULATORY_CARE_PROVIDER_SITE_OTHER): Payer: BLUE CROSS/BLUE SHIELD | Admitting: Psychology

## 2015-06-12 DIAGNOSIS — F411 Generalized anxiety disorder: Secondary | ICD-10-CM

## 2015-06-16 ENCOUNTER — Encounter: Payer: Self-pay | Admitting: Family Medicine

## 2015-06-26 ENCOUNTER — Ambulatory Visit (INDEPENDENT_AMBULATORY_CARE_PROVIDER_SITE_OTHER): Payer: BLUE CROSS/BLUE SHIELD | Admitting: Psychology

## 2015-06-26 DIAGNOSIS — F411 Generalized anxiety disorder: Secondary | ICD-10-CM

## 2015-10-10 ENCOUNTER — Encounter: Payer: Self-pay | Admitting: Family Medicine

## 2015-10-10 ENCOUNTER — Ambulatory Visit (INDEPENDENT_AMBULATORY_CARE_PROVIDER_SITE_OTHER): Payer: BLUE CROSS/BLUE SHIELD | Admitting: Family Medicine

## 2015-10-10 VITALS — BP 110/68 | HR 65 | Temp 98.3°F | Ht 74.0 in | Wt 217.5 lb

## 2015-10-10 DIAGNOSIS — Z8349 Family history of other endocrine, nutritional and metabolic diseases: Secondary | ICD-10-CM | POA: Diagnosis not present

## 2015-10-10 DIAGNOSIS — Z8249 Family history of ischemic heart disease and other diseases of the circulatory system: Secondary | ICD-10-CM

## 2015-10-10 DIAGNOSIS — Z0000 Encounter for general adult medical examination without abnormal findings: Secondary | ICD-10-CM

## 2015-10-10 DIAGNOSIS — K602 Anal fissure, unspecified: Secondary | ICD-10-CM | POA: Insufficient documentation

## 2015-10-10 DIAGNOSIS — L989 Disorder of the skin and subcutaneous tissue, unspecified: Secondary | ICD-10-CM

## 2015-10-10 DIAGNOSIS — F411 Generalized anxiety disorder: Secondary | ICD-10-CM

## 2015-10-10 DIAGNOSIS — M722 Plantar fascial fibromatosis: Secondary | ICD-10-CM

## 2015-10-10 NOTE — Assessment & Plan Note (Signed)
Tetanus 2010 Flu encouraged PNA and shingles not due.  Colon and prostate cancer screening not due.  Living will d/w pt.  Wife designated if patient were incapacitated.   Diet and exercise d/w pt.  Doing a 5K obstacle run this weekend, playing softball.  Diet is better.   His anxiety and stress level are much better after his job change- not working at [employer] now. He is on a steady shift now with holidays off.  He is happier overall.   HIV screening prev neg.  Fasting for labs, pending.

## 2015-10-10 NOTE — Patient Instructions (Addendum)
Go to the lab on the way out.  We'll contact you with your lab report. Let me know if you need a referral to Dr. Milinda Pointer.  Get arch support shoes in the meantime.  Take care.  Glad to see you.

## 2015-10-10 NOTE — Progress Notes (Signed)
Pre visit review using our clinic review tool, if applicable. No additional management support is needed unless otherwise documented below in the visit note.  CPE- See plan.  Routine anticipatory guidance given to patient.  See health maintenance. Tetanus 2010 Flu encouraged PNA and shingles not due.  Colon and prostate cancer screening not due.  Living will d/w pt.  Wife designated if patient were incapacitated.   Diet and exercise d/w pt.  Doing a 5K obstacle run this weekend, playing softball.  Diet is better.   His anxiety and stress level are much better after his job change- not working at [employer] now. He is on a steady shift now with holidays off.  He is happier overall.   HIV screening prev neg.  Fasting for labs, pending.   BRBPR.  With wiping/BM.  Some pain occ.  Worse with hard stools.  No sig abd pain, none apparently related to the BRBPR.    Lump on L medial upper arm recently noted, wanted eval.  Not ttp.  Spot on R leg, posterior, had noted prev, wanted recheck.   L foot pain.  Plantar side.  Pain with first step in the AM.  Has tried plantar stretching w/o relief.  Tried icing his foot, rolling his foot on a ball.    PMH and SH reviewed  Meds, vitals, and allergies reviewed.   ROS: Per HPI.  Unless specifically indicated otherwise in HPI, the patient denies:  General: fever. Eyes: acute vision changes ENT: sore throat Cardiovascular: chest pain Respiratory: SOB GI: vomiting GU: dysuria Musculoskeletal: acute back pain Derm: acute rash Neuro: acute motor dysfunction Psych: worsening mood Endocrine: polydipsia Heme: bleeding Allergy: hayfever  GEN: nad, alert and oriented HEENT: mucous membranes moist NECK: supple w/o LA CV: rrr. PULM: ctab, no inc wob ABD: soft, +bs EXT: no edema SKIN: no acute rash L medial upper arm with small lipoma noted.  58mm lesion that looks similar to prev- not ulcerated, just likely old chronic scar tissue on R calf L  foot with normal inspection, high arches B, L plantar fascia ttp.  Rectal exam with resolving fissure noted at 12 o'clock.  No external hemorrhoids noted.

## 2015-10-10 NOTE — Assessment & Plan Note (Addendum)
Gave info re: podiatry clinic.  He'll call about f/u.  Continue insert use, needs arch support shoes.

## 2015-10-10 NOTE — Assessment & Plan Note (Signed)
Improved with job change.  Continue off med.  Doing well.

## 2015-10-10 NOTE — Assessment & Plan Note (Signed)
Likely the issue, d/w pt.  Resolving.  Use stool softener prn.

## 2015-10-10 NOTE — Assessment & Plan Note (Signed)
L arm lipoma, R leg scar tissue, reassured re: both. F/u prn.

## 2015-10-11 LAB — COMPREHENSIVE METABOLIC PANEL
ALBUMIN: 4.8 g/dL (ref 3.5–5.2)
ALK PHOS: 74 U/L (ref 39–117)
ALT: 18 U/L (ref 0–53)
AST: 20 U/L (ref 0–37)
BILIRUBIN TOTAL: 0.8 mg/dL (ref 0.2–1.2)
BUN: 10 mg/dL (ref 6–23)
CO2: 29 mEq/L (ref 19–32)
CREATININE: 0.86 mg/dL (ref 0.40–1.50)
Calcium: 9.7 mg/dL (ref 8.4–10.5)
Chloride: 97 mEq/L (ref 96–112)
GFR: 111.63 mL/min (ref >60.00)
Glucose, Bld: 94 mg/dL (ref 70–99)
POTASSIUM: 4 meq/L (ref 3.5–5.1)
SODIUM: 133 meq/L — AB (ref 135–145)
TOTAL PROTEIN: 7.5 g/dL (ref 6.0–8.3)

## 2015-10-11 LAB — LIPID PANEL
CHOLESTEROL: 157 mg/dL (ref 0–200)
HDL: 41.7 mg/dL (ref >39.00)
LDL Cholesterol: 106 mg/dL — ABNORMAL HIGH (ref 0–99)
NonHDL: 115.03
Total CHOL/HDL Ratio: 4
Triglycerides: 45 mg/dL (ref 0.0–149.0)
VLDL: 9 mg/dL (ref 0.0–40.0)

## 2015-10-11 LAB — TSH: TSH: 0.29 u[IU]/mL — ABNORMAL LOW (ref 0.35–4.50)

## 2015-11-06 ENCOUNTER — Ambulatory Visit (INDEPENDENT_AMBULATORY_CARE_PROVIDER_SITE_OTHER): Payer: BLUE CROSS/BLUE SHIELD | Admitting: Family Medicine

## 2015-11-06 ENCOUNTER — Encounter: Payer: Self-pay | Admitting: Family Medicine

## 2015-11-06 VITALS — BP 100/68 | HR 64 | Temp 98.9°F | Ht 74.0 in | Wt 221.5 lb

## 2015-11-06 DIAGNOSIS — N509 Disorder of male genital organs, unspecified: Secondary | ICD-10-CM | POA: Diagnosis not present

## 2015-11-06 DIAGNOSIS — L989 Disorder of the skin and subcutaneous tissue, unspecified: Secondary | ICD-10-CM

## 2015-11-06 DIAGNOSIS — N50819 Testicular pain, unspecified: Secondary | ICD-10-CM | POA: Insufficient documentation

## 2015-11-06 NOTE — Patient Instructions (Signed)
If the spots are changing, then let me know.  You don't have to do anything about any of them.  Take care.  Glad to see you.

## 2015-11-06 NOTE — Assessment & Plan Note (Signed)
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  Reassured.  Similar lesion on the L upper medial arm, also likely a lipoma, <1cm. Reassured.  Macule on the glans- ABCDE guidelines d/w pt.  20mm.  Uniform, round border.  Reassure.  Would only follow clinically.  He'll notify me if changing.  Wouldn't be reasonable to biopsy, d/w pt.  He agrees.

## 2015-11-06 NOTE — Assessment & Plan Note (Signed)
He has some intermittent sensitivity on left testicle, with a likely old cyst prev noted with prev ultrasound done ~2005 in high school.  By hx, he sounds to have a stable situation and I wouldn't do anything else unless he had more pain/discomfort.  He agrees.  Update me as needed.  No concern at this point for ominous dx.

## 2015-11-06 NOTE — Progress Notes (Signed)
Pre visit review using our clinic review tool, if applicable. No additional management support is needed unless otherwise documented below in the visit note.  Spot on his L arm got some bigger (possibly) in the meantime.  Some discomfort, not all the time.  Not red, didn't drain any.  Also with a new lesion on the R forearm noted in the meantime unclear duration on that lesion.    Possible birthmark on the genitals.  Wanted it checked.    He has some intermittent sensitivity on left testicle, with a likely old cyst prev noted with prev ultrasound done ~2005 in high school.    He feels well o/w. No FCNAVD.    Meds, vitals, and allergies reviewed.   ROS: Per HPI unless specifically indicated in ROS section   nad R forearm with likely small (<<1cm) lipoma.  Not irritated.   Similar lesion on the L upper medial arm, likely a lipoma.   Uniform 47mm hyperpigmented macule on the distal inferior glans, just below the meatus.  Small round cystic lesion on the L testicle noted, inferiorly.

## 2016-01-31 ENCOUNTER — Encounter: Payer: Self-pay | Admitting: Family Medicine

## 2016-01-31 ENCOUNTER — Ambulatory Visit (INDEPENDENT_AMBULATORY_CARE_PROVIDER_SITE_OTHER): Payer: BLUE CROSS/BLUE SHIELD | Admitting: Family Medicine

## 2016-01-31 VITALS — BP 122/80 | HR 52 | Temp 97.8°F | Wt 228.5 lb

## 2016-01-31 DIAGNOSIS — I498 Other specified cardiac arrhythmias: Secondary | ICD-10-CM

## 2016-01-31 LAB — CBC WITH DIFFERENTIAL/PLATELET
BASOS ABS: 90 {cells}/uL (ref 0–200)
BASOS PCT: 1 %
EOS ABS: 360 {cells}/uL (ref 15–500)
Eosinophils Relative: 4 %
HEMATOCRIT: 43.7 % (ref 38.5–50.0)
Hemoglobin: 15.2 g/dL (ref 13.2–17.1)
LYMPHS PCT: 34 %
Lymphs Abs: 3060 cells/uL (ref 850–3900)
MCH: 32.3 pg (ref 27.0–33.0)
MCHC: 34.8 g/dL (ref 32.0–36.0)
MCV: 93 fL (ref 80.0–100.0)
MONO ABS: 720 {cells}/uL (ref 200–950)
MONOS PCT: 8 %
MPV: 9.7 fL (ref 7.5–12.5)
NEUTROS ABS: 4770 {cells}/uL (ref 1500–7800)
Neutrophils Relative %: 53 %
PLATELETS: 279 10*3/uL (ref 140–400)
RBC: 4.7 MIL/uL (ref 4.20–5.80)
RDW: 12.9 % (ref 11.0–15.0)
WBC: 9 10*3/uL (ref 3.8–10.8)

## 2016-01-31 NOTE — Progress Notes (Signed)
Pre visit review using our clinic review tool, if applicable. No additional management support is needed unless otherwise documented below in the visit note. 

## 2016-01-31 NOTE — Patient Instructions (Signed)
We'll contact you with your lab report. Taper caffeine in the meantime.  If you still have symptoms, then let me know.  Take care.  Glad to see you.

## 2016-01-31 NOTE — Progress Notes (Signed)
He noted fluttering in chest with exercise.  Noted with softball usually.  Last night he felt either fluttering or skipped beats.  Started before the game.  He got worried and then got lightheaded.  Unclear if the lightheadedness was related to worry or the sx themselves.  No syncope.    His wife checked pulse but it didn't feel irregular.    Work is going well.  Life is good in general for patient.  Nonsmoker.   Caffeine d/w pt.  "A lot."  2 cups of coffee, then soda later in the day (32 oz soda).    Meds, vitals, and allergies reviewed.   ROS: Per HPI unless specifically indicated in ROS section   GEN: nad, alert and oriented HEENT: mucous membranes moist NECK: supple w/o LA CV: rrr.  no murmur PULM: ctab, no inc wob ABD: soft, +bs EXT: no edema  EKG wnl, d/w pt at OV.   Labs pending.

## 2016-02-01 LAB — BASIC METABOLIC PANEL
BUN: 12 mg/dL (ref 7–25)
CALCIUM: 9.5 mg/dL (ref 8.6–10.3)
CHLORIDE: 102 mmol/L (ref 98–110)
CO2: 23 mmol/L (ref 20–31)
CREATININE: 0.97 mg/dL (ref 0.60–1.35)
GLUCOSE: 81 mg/dL (ref 65–99)
Potassium: 4.1 mmol/L (ref 3.5–5.3)
Sodium: 141 mmol/L (ref 135–146)

## 2016-02-01 LAB — TSH: TSH: 1.2 mIU/L (ref 0.40–4.50)

## 2016-02-02 NOTE — Assessment & Plan Note (Signed)
He has the sensation of fluttering or extra beats, but no alarming symptoms at this point. No syncope. Still okay for outpatient follow-up. EKG within normal limits that office visit, discussed with patient. See notes on labs. Labs are unremarkable. Would taper caffeine. If still with residual symptoms then I want him to let me know. >25 minutes spent in face to face time with patient, >50% spent in counselling or coordination of care./

## 2016-02-14 ENCOUNTER — Encounter: Payer: Self-pay | Admitting: Family Medicine

## 2016-02-14 ENCOUNTER — Ambulatory Visit (INDEPENDENT_AMBULATORY_CARE_PROVIDER_SITE_OTHER): Payer: BLUE CROSS/BLUE SHIELD | Admitting: Family Medicine

## 2016-02-14 VITALS — BP 126/80 | HR 73 | Temp 98.2°F | Wt 230.5 lb

## 2016-02-14 DIAGNOSIS — Z23 Encounter for immunization: Secondary | ICD-10-CM

## 2016-02-14 DIAGNOSIS — I498 Other specified cardiac arrhythmias: Secondary | ICD-10-CM

## 2016-02-14 DIAGNOSIS — N509 Disorder of male genital organs, unspecified: Secondary | ICD-10-CM | POA: Diagnosis not present

## 2016-02-14 DIAGNOSIS — S76312A Strain of muscle, fascia and tendon of the posterior muscle group at thigh level, left thigh, initial encounter: Secondary | ICD-10-CM

## 2016-02-14 DIAGNOSIS — N50819 Testicular pain, unspecified: Secondary | ICD-10-CM | POA: Diagnosis not present

## 2016-02-14 DIAGNOSIS — S76319A Strain of muscle, fascia and tendon of the posterior muscle group at thigh level, unspecified thigh, initial encounter: Secondary | ICD-10-CM | POA: Insufficient documentation

## 2016-02-14 DIAGNOSIS — K648 Other hemorrhoids: Secondary | ICD-10-CM

## 2016-02-14 DIAGNOSIS — R1310 Dysphagia, unspecified: Secondary | ICD-10-CM

## 2016-02-14 MED ORDER — RANITIDINE HCL 150 MG PO TABS: 150.0000 mg | ORAL_TABLET | Freq: Two times a day (BID) | ORAL | Status: DC

## 2016-02-14 MED ORDER — HYDROCORTISONE ACETATE 25 MG RE SUPP: 25.0000 mg | Freq: Two times a day (BID) | RECTAL | 0 refills | Status: DC

## 2016-02-14 NOTE — Progress Notes (Signed)
Heart fluttering.  He thinks he is some better in the meantime.  Was able to play softball recently, minimal sx and was able to play the field w/o troubles.  Was able to play last night.  Again with minimal sx last night.  It seems that high excitement times make it more noticeable.  He has cut back on caffeine in the meantime.  No syncope.    Wife is pregnant.  They just found out, she is going to the doc to get checked soon, for a routine check.  D/w pt about routine vaccination for him.  Tdap and flu done today.    Hemorrhoids.  Some blood in stool, with BM.  Recently flared up.  Some pain, with irritation from wiping.  No meds tried yet.    Prev with episodic soreness in L testicle.  Tightness/pressure in the testicle.    Throat has felt irritated.  Feels like food sticking with swallowing.  Unclear if he is tensing up when swallowing.  No voice changes.  More bothersome to patient when he gets self aware about it.    Hamstring strain last night in L hamstring.  Pain with full extension.  No bruising.  No pop or snap.  No lump in the area.    Meds, vitals, and allergies reviewed.   ROS: Per HPI unless specifically indicated in ROS section   GEN: nad, alert and oriented HEENT: mucous membranes moist NECK: supple w/o LA CV: rrr. PULM: ctab, no inc wob ABD: soft, +bs EXT: no edema Rectal exam w/o gross blood or ext hemorrhoid.   L hamstring ttp w/o bruising or popeye deformity noted.

## 2016-02-14 NOTE — Addendum Note (Signed)
Addended by: Josetta Huddle on: 02/14/2016 04:29 PM   Modules accepted: Orders

## 2016-02-14 NOTE — Assessment & Plan Note (Signed)
Improved, no intolerance to exercise, continue caffeine taper.

## 2016-02-14 NOTE — Assessment & Plan Note (Signed)
Ice, gently stretch, gradual return to exercise.  Encouraged not to play field in next softball game.

## 2016-02-14 NOTE — Assessment & Plan Note (Signed)
D/w pt.  OP wnl, no stridor.  No choking.  Would try zantac to see if this is reflux related, continue to taper caffeine, update me if not better.  He agrees.

## 2016-02-14 NOTE — Assessment & Plan Note (Signed)
U/s ordered.  Likely benign issue, but would image to define the issue.

## 2016-02-14 NOTE — Progress Notes (Signed)
Pre visit review using our clinic review tool, if applicable. No additional management support is needed unless otherwise documented below in the visit note. 

## 2016-02-14 NOTE — Patient Instructions (Addendum)
Congrats.   Keep tapering back on caffeine and update me as needed.  Roy Huber will call about your referral for the ultrasound.  Try zantac/ranitidine.  See if that helps with swallowing.  If not then let me know.  Take care.  Glad to see you.

## 2016-02-14 NOTE — Assessment & Plan Note (Signed)
No fissure or ext hemorrhoids seen.  Likely internal hemorrhoids.  Try hydrocortisone suppository.

## 2016-02-17 ENCOUNTER — Other Ambulatory Visit: Payer: Self-pay | Admitting: Family Medicine

## 2016-02-17 DIAGNOSIS — N509 Disorder of male genital organs, unspecified: Principal | ICD-10-CM

## 2016-02-17 DIAGNOSIS — N50819 Testicular pain, unspecified: Secondary | ICD-10-CM

## 2016-02-23 ENCOUNTER — Encounter: Payer: Self-pay | Admitting: Family Medicine

## 2016-02-26 ENCOUNTER — Encounter: Payer: Self-pay | Admitting: Family Medicine

## 2016-02-27 ENCOUNTER — Telehealth: Payer: Self-pay

## 2016-02-27 ENCOUNTER — Other Ambulatory Visit: Payer: Self-pay | Admitting: Family Medicine

## 2016-02-27 DIAGNOSIS — I498 Other specified cardiac arrhythmias: Secondary | ICD-10-CM

## 2016-02-27 NOTE — Telephone Encounter (Signed)
Pt returned call stating [employer] advised him to call back regarding insurance vs self pay once he receives the monitor. He is agreeable w/plan.  Reviewed monitor instructions w/pt who verbalized understanding and will await delivery.

## 2016-02-27 NOTE — Telephone Encounter (Signed)
Per Dr. Damita Dunnings, pt needs 14-day event monitor for aflutter. Order placed in [employer]. Left message for patient.

## 2016-02-27 NOTE — Telephone Encounter (Signed)
Pt called back giving permission to leave him detailed VM as he is concerned of the cost of event monitor.  S/w Mitch @ [employer]. Cost $800-$900 if billed through insurance. $350 cash pay w/payment plan options. Pt should call billing @ [phone] to make arrangements. Left detailed message on pt cell VM. Provided our CB number once pt decides how to proceed.

## 2016-02-27 NOTE — Progress Notes (Signed)
Needs cardiac monitor.  Thanks.   This is being set up- I talked to cards office this AM.

## 2016-02-28 ENCOUNTER — Ambulatory Visit
Admission: RE | Admit: 2016-02-28 | Discharge: 2016-02-28 | Disposition: A | Discharge status: Home / Self Care | Payer: BLUE CROSS/BLUE SHIELD | Source: Ambulatory Visit | Attending: Family Medicine | Admitting: Family Medicine

## 2016-02-28 DIAGNOSIS — N50819 Testicular pain, unspecified: Secondary | ICD-10-CM

## 2016-02-28 DIAGNOSIS — N509 Disorder of male genital organs, unspecified: Principal | ICD-10-CM

## 2016-03-02 ENCOUNTER — Ambulatory Visit (INDEPENDENT_AMBULATORY_CARE_PROVIDER_SITE_OTHER): Payer: BLUE CROSS/BLUE SHIELD

## 2016-03-02 DIAGNOSIS — I498 Other specified cardiac arrhythmias: Secondary | ICD-10-CM

## 2016-03-25 ENCOUNTER — Telehealth: Payer: Self-pay | Admitting: Family Medicine

## 2016-03-25 MED ORDER — METOPROLOL TARTRATE 25 MG PO TABS: 12.5000 mg | ORAL_TABLET | Freq: Two times a day (BID) | ORAL | 3 refills | Status: DC

## 2016-03-25 NOTE — Telephone Encounter (Signed)
Pt called to see if any results were received from the heart monitor that was ordered. He mailed the monitor back on Monday10/23 and wants to know when he will hear results. Pt asked to call home number and leave detailed msg if no answer.

## 2016-03-25 NOTE — Telephone Encounter (Signed)
Patient notified as instructed by telephone and verbalized understanding. 

## 2016-03-25 NOTE — Telephone Encounter (Addendum)
I have reviewed it and send a note to cardiology just to make sure they didn't have anything to add.  Nothing emergent on the monitor.  Some elevated heart rates that are normal.  A few extra beats (PVCs) that are not a problem.  One episode of possible SVT but this was not sustained and likely not significant since it resolved so quickly.  It is possible that some of the events described above can be felt by the patient but still not be serious.   I would try a low dose of metoprolol BID and see if that helps.  He can take it just prn (1/2 tab bid prn) if needed/desired.  Overall- nothing emergent and overall good news.  rx sent.  Thanks.

## 2016-04-01 ENCOUNTER — Ambulatory Visit (INDEPENDENT_AMBULATORY_CARE_PROVIDER_SITE_OTHER): Payer: BLUE CROSS/BLUE SHIELD | Admitting: Family Medicine

## 2016-04-01 ENCOUNTER — Encounter: Payer: Self-pay | Admitting: Family Medicine

## 2016-04-01 DIAGNOSIS — I498 Other specified cardiac arrhythmias: Secondary | ICD-10-CM | POA: Diagnosis not present

## 2016-04-01 MED ORDER — RANITIDINE HCL 150 MG PO TABS: 150.0000 mg | ORAL_TABLET | Freq: Two times a day (BID) | ORAL | Status: AC | PRN

## 2016-04-01 NOTE — Progress Notes (Signed)
He needed a substitute for anusol suppository since it was too expensive as prev rx'd.   D/w pt about options, ie prep H.   Palpitations/heart rate concerns. Not on metoprolol yet.  He still has some heart rhythm sx, intermittently.  No syncope.  He felt better on the monitor, "knowing that someone was looking after it."  He noted more sx after the testing was done and the monitor was off.  He thinks anxiety can possibly contribute, d/w pt (I agree it can contribute).  He cut back on caffeine, hasn't quit, but less.    His wife is pregnant, [redacted] weeks gestation.  She is doing well.    Meds, vitals, and allergies reviewed.   ROS: Per HPI unless specifically indicated in ROS section   GEN: nad, alert and oriented HEENT: mucous membranes moist NECK: supple w/o LA CV: rrr.  no murmur, no ectopy  PULM: ctab, no inc wob

## 2016-04-01 NOTE — Progress Notes (Signed)
Pre visit review using our clinic review tool, if applicable. No additional management support is needed unless otherwise documented below in the visit note. 

## 2016-04-01 NOTE — Patient Instructions (Addendum)
I would try taking the metoprolol once a day.  See how you feel and update me as needed.  Take care.  Glad to see you.  See if your insurance covers options other than prep H.

## 2016-04-02 ENCOUNTER — Encounter: Payer: Self-pay | Admitting: Family Medicine

## 2016-04-02 NOTE — Assessment & Plan Note (Signed)
Discussed with patient in detail about results from previous cardiac monitoring. He had some PACs and PVCs. He also had sinus arrhythmia and sinus tachycardia. Neither I nor the reviewing cardiologist think the patient had true SVT. It is likely that the patient could be symptomatic from the PACs and PVCs and sinus tachycardia. None of these rhythms are ominous. Discussed with patient. He is fine and exercise. Discussed with him about gradual return to exercise. Discussed with him about options regarding metoprolol use. He could take it just as needed, once a day, or twice a day. We agreed on the once a day dosing. He wanted to take the first dose on a day when he was working and when his wife would be at home. I think this is reasonable. I don't expect such a low dose to drop his blood pressure, with 12.5 mg of metoprolol per dose. He will update me as needed. Okay for outpatient follow-up >25 minutes spent in face to face time with patient, >50% spent in counselling or coordination of care.

## 2016-08-28 ENCOUNTER — Encounter: Payer: Self-pay | Admitting: Family Medicine

## 2016-08-28 ENCOUNTER — Ambulatory Visit (INDEPENDENT_AMBULATORY_CARE_PROVIDER_SITE_OTHER): Payer: BLUE CROSS/BLUE SHIELD | Admitting: Family Medicine

## 2016-08-28 DIAGNOSIS — R194 Change in bowel habit: Secondary | ICD-10-CM

## 2016-08-28 DIAGNOSIS — J029 Acute pharyngitis, unspecified: Secondary | ICD-10-CM

## 2016-08-28 DIAGNOSIS — I498 Other specified cardiac arrhythmias: Secondary | ICD-10-CM

## 2016-08-28 NOTE — Progress Notes (Signed)
Pre visit review using our clinic review tool, if applicable. No additional management support is needed unless otherwise documented below in the visit note. 

## 2016-08-28 NOTE — Patient Instructions (Signed)
Your throat looks good.  If you have more pain then update me.  You can gargle with salt water if needed.   This can be related to heartburn.  If you have more heartburn, then addressing that may help your throat.  Take care.  Glad to see you.

## 2016-08-28 NOTE — Progress Notes (Signed)
His wife is [redacted] weeks pregnant.  She is doing well.  He is happy about all of this.    He never took metoprolol.  Still has some fluttering sensation occ but overall he doing well.  He can exercise w/o troubles.  Played softball recently w/o troubles.    ST.  Started about 2-3 weeks ago.  It came on, then started getting better.  Would wax and wane over the last few weeks.  Then 1 week ago it got worse, more pain on the L side.  He made an appointment here, some better in the meantime but kept the appointment get checked.    No FCNAVD.  He doesn't feel unwell o/w.    He has some occasional difficulties with BMs. He has not passed blood. He will have a normal bowel movement. He will get cleaned up, but then after the fact he will feel the need to go get cleaned up again. He will occasionally have a scant amount of stool at that point. No incontinence.  Meds, vitals, and allergies reviewed.   ROS: Per HPI unless specifically indicated in ROS section   GEN: nad, alert and oriented HEENT: mucous membranes moist, Op wnl, no lesions. NECK: supple w/o LA CV: rrr. ctab abd soft

## 2016-08-30 DIAGNOSIS — R194 Change in bowel habit: Secondary | ICD-10-CM | POA: Insufficient documentation

## 2016-08-30 DIAGNOSIS — J029 Acute pharyngitis, unspecified: Secondary | ICD-10-CM | POA: Insufficient documentation

## 2016-08-30 NOTE — Assessment & Plan Note (Signed)
This looks to be a benign and resolved issue. Differential diagnosis discussed with patient. No pain now and normal exam. If more pain then update me.  Can gargle with salt water if needed.   This can be related to heartburn.  If more heartburn, then addressing that may help his throat.  He'll update me as needed.

## 2016-08-30 NOTE — Assessment & Plan Note (Signed)
Occasionally noted but he has been able to exercise without troubles and he did not want to take metoprolol at this point. Okay for outpatient follow-up. I do not suspect any type of ominous diagnosis. >25 minutes spent in face to face time with patient, >50% spent in counselling or coordination of care.

## 2016-08-30 NOTE — Assessment & Plan Note (Signed)
It may be worthwhile to try pelvic floor exercises to see if this makes a difference for the patient. No alarming symptoms otherwise. Okay for outpatient follow-up. He'll update me as needed.

## 2016-10-12 ENCOUNTER — Ambulatory Visit (INDEPENDENT_AMBULATORY_CARE_PROVIDER_SITE_OTHER): Payer: BLUE CROSS/BLUE SHIELD | Admitting: Family Medicine

## 2016-10-12 ENCOUNTER — Encounter: Payer: Self-pay | Admitting: Family Medicine

## 2016-10-12 DIAGNOSIS — M5432 Sciatica, left side: Secondary | ICD-10-CM

## 2016-10-12 DIAGNOSIS — M5430 Sciatica, unspecified side: Secondary | ICD-10-CM | POA: Insufficient documentation

## 2016-10-12 MED ORDER — CYCLOBENZAPRINE HCL 10 MG PO TABS: 5.0000 mg | ORAL_TABLET | Freq: Three times a day (TID) | ORAL | 0 refills | Status: AC | PRN

## 2016-10-12 NOTE — Progress Notes (Signed)
His daughter Roy Huber was born just before 36 weeks.  Wife had post partum hemorrhage but is doing well now.  All doing okay now, I offered congrats.    Back pain.  "it always kind of hurts since high school."  Episodic L lower back pain flares but now with different radicular pain.  Pain when he get up from sitting recently.  No trauma.  Pain walking.  Today is some better.  Walking hunched over, standing straight up is painful.  Sharp pain and can radiate down the L leg.  No R sided pain.  No B/B sx.  No true weakness.   He has tried aleve and icy hot.    Meds, vitals, and allergies reviewed.   ROS: Per HPI unless specifically indicated in ROS section   GEN: nad, alert and oriented HEENT: mucous membranes moist NECK: supple w/o LA CV: rrr.  no murmur PULM: ctab, no inc wob EXT: no edema SKIN: no bruising.   S/S wnl BLE L lower back ttp w/o bruising.  No midline pain.  No CVA pain.  L SLR positive.   Hamstrings tight.

## 2016-10-12 NOTE — Patient Instructions (Signed)
Try ice and the back exercises.  Aleve with food.  Flexeril as needed-sedation caution.  Take care.  Glad to see you.

## 2016-10-12 NOTE — Assessment & Plan Note (Signed)
Advised to start icing his back, use home back exercises- handout discussed.   Aleve with food.  Flexeril as needed-sedation caution.  Anatomy and path/phys d/w pt.  Update me as needed.  No emergent sx.  He agrees.

## 2016-11-13 ENCOUNTER — Encounter: Payer: Self-pay | Admitting: Family Medicine

## 2016-11-13 ENCOUNTER — Ambulatory Visit (INDEPENDENT_AMBULATORY_CARE_PROVIDER_SITE_OTHER): Payer: BLUE CROSS/BLUE SHIELD | Admitting: Family Medicine

## 2016-11-13 VITALS — BP 110/78 | HR 65 | Temp 98.8°F | Wt 230.0 lb

## 2016-11-13 DIAGNOSIS — Z8349 Family history of other endocrine, nutritional and metabolic diseases: Secondary | ICD-10-CM

## 2016-11-13 DIAGNOSIS — Z0000 Encounter for general adult medical examination without abnormal findings: Secondary | ICD-10-CM

## 2016-11-13 DIAGNOSIS — Z8249 Family history of ischemic heart disease and other diseases of the circulatory system: Secondary | ICD-10-CM

## 2016-11-13 DIAGNOSIS — R194 Change in bowel habit: Secondary | ICD-10-CM

## 2016-11-13 DIAGNOSIS — Z131 Encounter for screening for diabetes mellitus: Secondary | ICD-10-CM

## 2016-11-13 DIAGNOSIS — I498 Other specified cardiac arrhythmias: Secondary | ICD-10-CM

## 2016-11-13 DIAGNOSIS — F411 Generalized anxiety disorder: Secondary | ICD-10-CM

## 2016-11-13 LAB — LIPID PANEL
CHOL/HDL RATIO: 3.8 ratio (ref <5.0)
CHOLESTEROL: 169 mg/dL (ref <200)
HDL: 44 mg/dL (ref >40)
LDL Cholesterol: 100 mg/dL — ABNORMAL HIGH (ref <100)
Triglycerides: 123 mg/dL (ref <150)
VLDL: 25 mg/dL (ref <30)

## 2016-11-13 LAB — GLUCOSE, RANDOM: Glucose, Bld: 86 mg/dL (ref 65–99)

## 2016-11-13 NOTE — Progress Notes (Signed)
CPE- See plan.  Routine anticipatory guidance given to patient.  See health maintenance.  The possibility exists that previously documented standard health maintenance information may have been brought forward from a previous encounter into this note.  If needed, that same information has been updated to reflect the current situation based on today's encounter.    Tetanus 2017 Flu encouraged.   PNA and shingles not due.  Colon and prostate cancer screening not due.  Living will d/w pt.  Wife designated if patient were incapacitated.   Diet and exercise d/w pt.  He is playing softball.  Diet discussed, encouraged healthy diet.   HIV screening prev neg.  Fasting for labs, pending.   FH CAD, grandparent, d/w pt.  Lipid pending.   FH thyroid disease, father.  TSH pending.    Some sciatica pain after softball but better than prev.  No sx now.    Cardiac eval/hx d/w pt.  He had noted a few skipped beats prev.  He isn't on BB.  He can forget about his concerns during exercise/softball.  He thinks about it after exercise but no limitations during exercise.  No CP, not SOB, no syncope.  Neg eval prev, d/w pt.   Anxiety overall is better after prev job change.    He tried kegel exercises w/o relief of occ stool/evacuation changes.  dw pt about inc fiber in diet.    PMH and SH reviewed  Meds, vitals, and allergies reviewed.   ROS: Per HPI.  Unless specifically indicated otherwise in HPI, the patient denies:  General: fever. Eyes: acute vision changes ENT: sore throat Cardiovascular: chest pain Respiratory: SOB GI: vomiting GU: dysuria Musculoskeletal: acute back pain Derm: acute rash Neuro: acute motor dysfunction Psych: worsening mood Endocrine: polydipsia Heme: bleeding Allergy: hayfever  GEN: nad, alert and oriented HEENT: mucous membranes moist NECK: supple w/o LA CV: rrr. PULM: ctab, no inc wob ABD: soft, +bs EXT: no edema SKIN: no acute rash 8 mm scar noted on the right  calf. No change from previous exam. Benign appearing. He does have 2 lesions behind the right ear. Both look to be benign papules. One is 2 mm across one is 3 mm across. No ulceration.

## 2016-11-13 NOTE — Patient Instructions (Addendum)
We'll contact you with your lab report. Keep exercising.  Update me as needed.

## 2016-11-14 LAB — TSH: TSH: 1.15 m[IU]/L (ref 0.40–4.50)

## 2016-11-15 ENCOUNTER — Encounter: Payer: Self-pay | Admitting: Family Medicine

## 2016-11-15 NOTE — Assessment & Plan Note (Signed)
He is managing as is. Update me as needed. He agrees.

## 2016-11-15 NOTE — Assessment & Plan Note (Signed)
  Tetanus 2017 Flu encouraged.   PNA and shingles not due.  Colon and prostate cancer screening not due.  Living will d/w pt.  Wife designated if patient were incapacitated.   Diet and exercise d/w pt.  He is playing softball.  Diet discussed, encouraged healthy diet.   HIV screening prev neg.  Fasting for labs, pending.

## 2016-11-15 NOTE — Assessment & Plan Note (Signed)
Still occasionally noted. He did not want to try a beta blocker. No significant symptoms. No exertional chest pain. Update me as needed. Likely benign variation with PACs and PVCs occasionally notes.

## 2016-11-15 NOTE — Assessment & Plan Note (Signed)
Reasonable to try to increase fiber. No ominous symptoms otherwise. Discussed with patient.

## 2017-08-06 ENCOUNTER — Ambulatory Visit: Payer: Self-pay | Admitting: Family Medicine

## 2018-05-09 IMAGING — US US SCROTUM
1 series · 14 of 25 positions shown · non-contrast
Comparison: No prior .

CLINICAL DATA: Testicular tenderness for 1-1/2 months, left side
greater than right.

EXAM:
SCROTAL ULTRASOUND
DOPPLER ULTRASOUND OF THE TESTICLES
TECHNIQUE: Complete ultrasound examination of the testicles, epididymis, and
other scrotal structures was performed. Color and spectral Doppler
ultrasound were also utilized to evaluate blood flow to the
testicles.

[Series 1: us scrotum · 0.06mm/px · 14 of 68 slices shown]
[im 1/68]
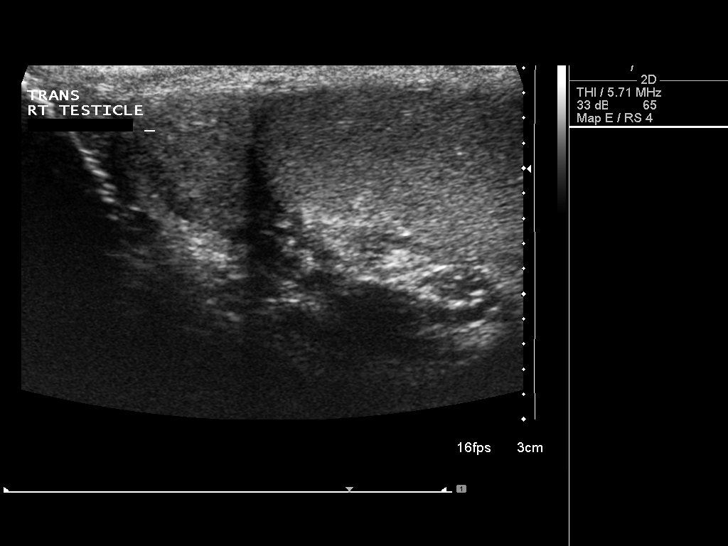
[im 6/68]
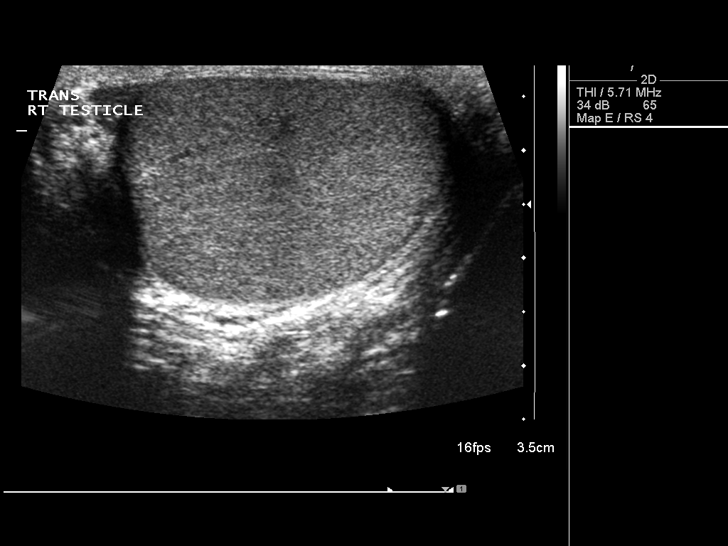
[im 12/68]
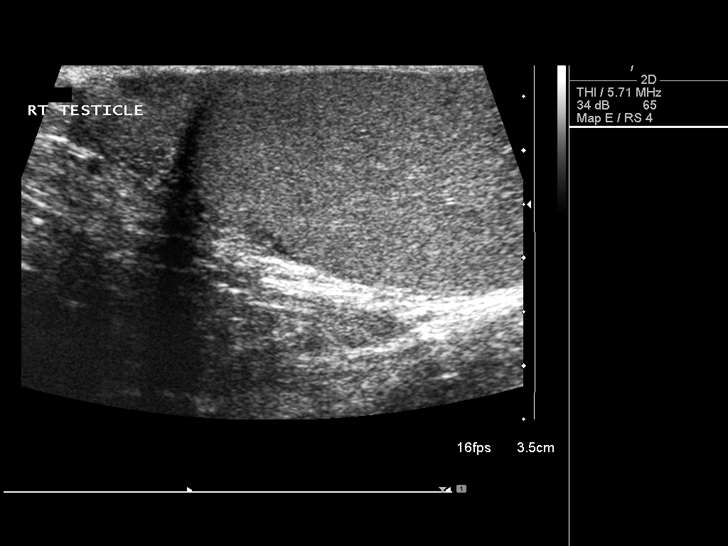
[im 17/68]
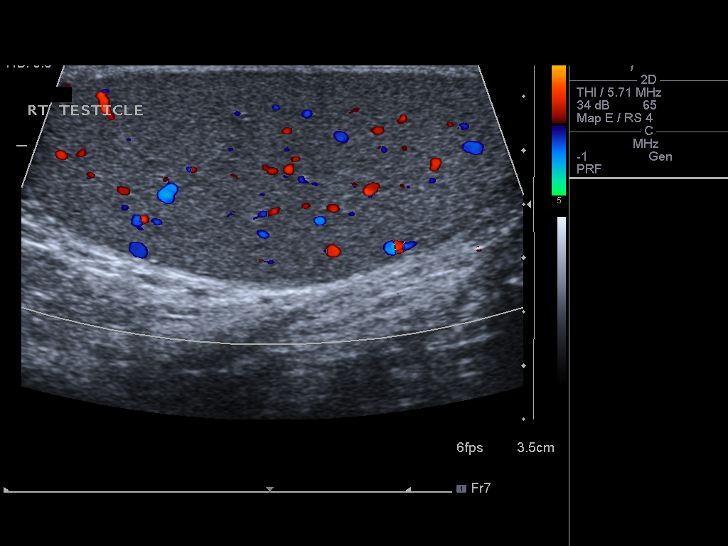
[im 23/68]
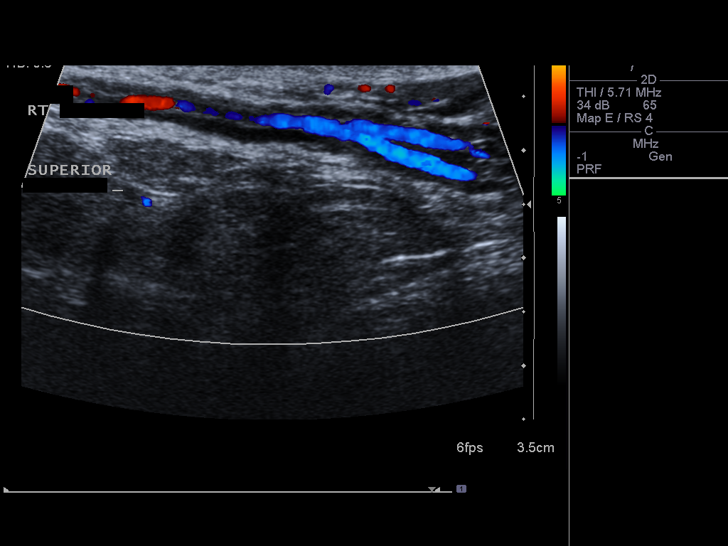
[im 26/68]
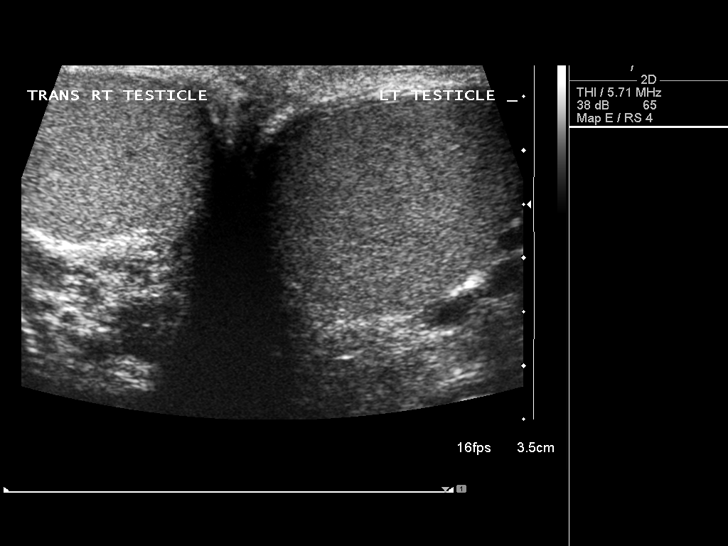
[im 31/68]
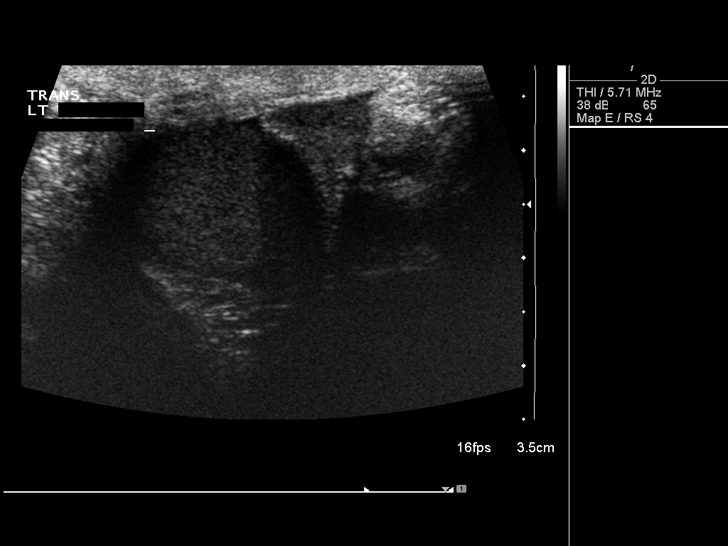
[im 37/68]
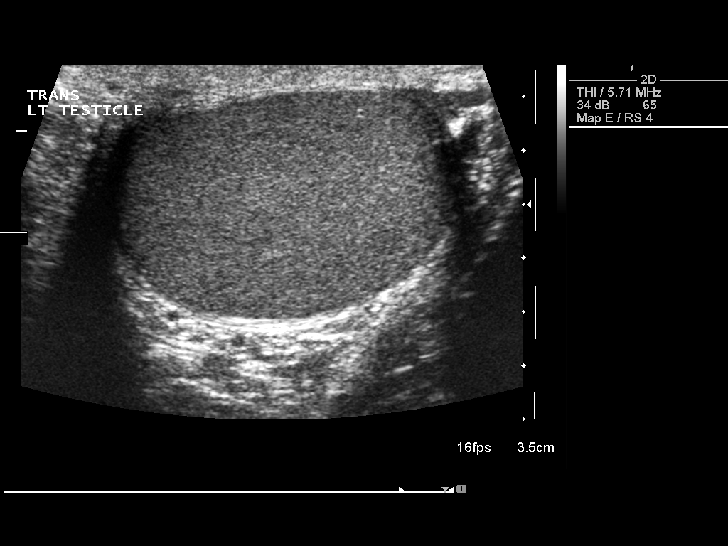
[im 42/68]
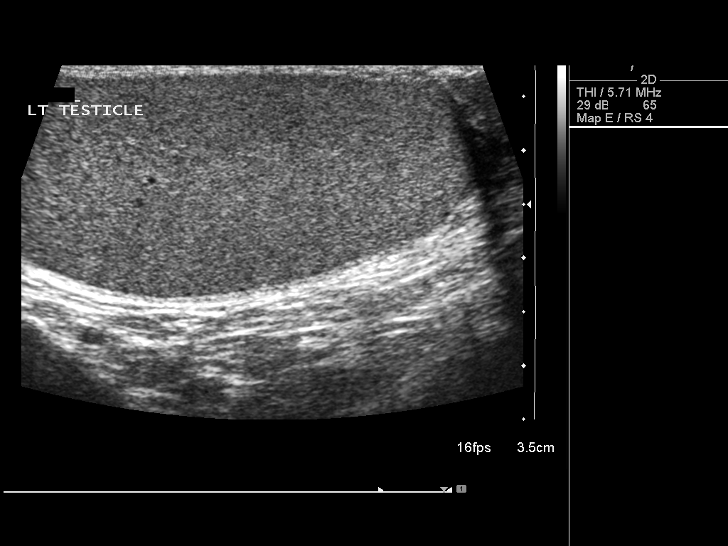
[im 45/68]
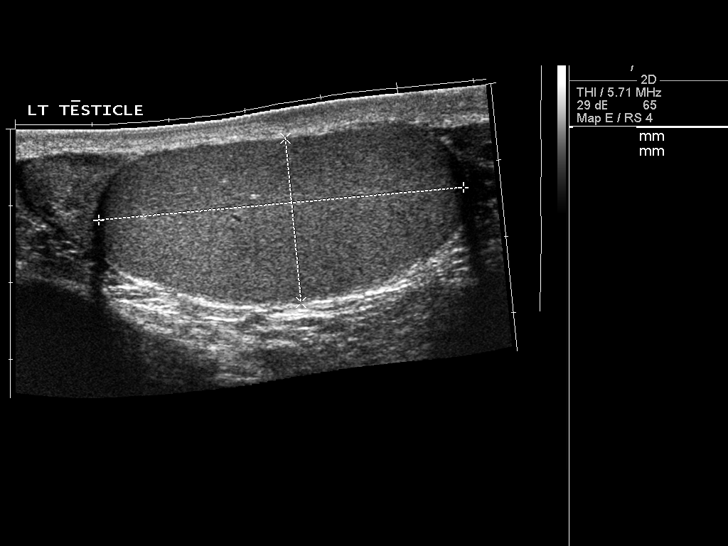
[im 51/68]
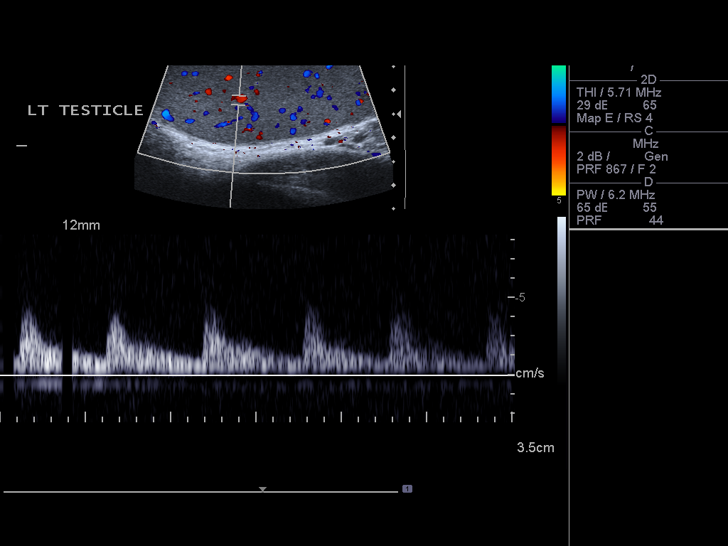
[im 56/68]
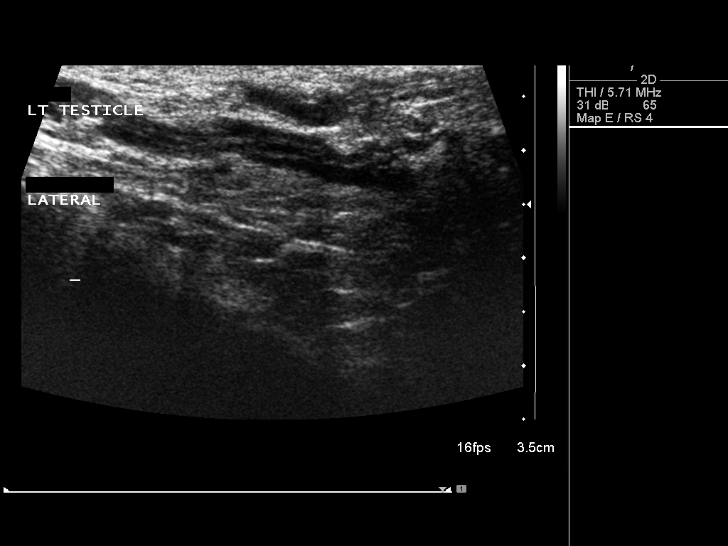
[im 62/68]
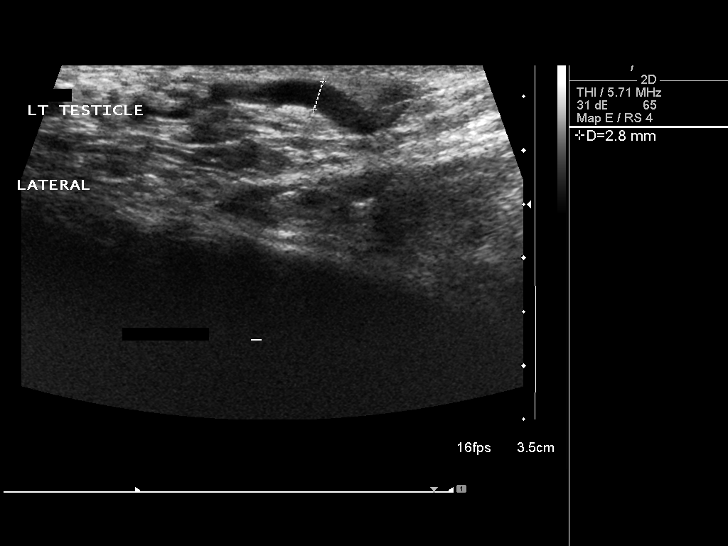
[im 68/68]
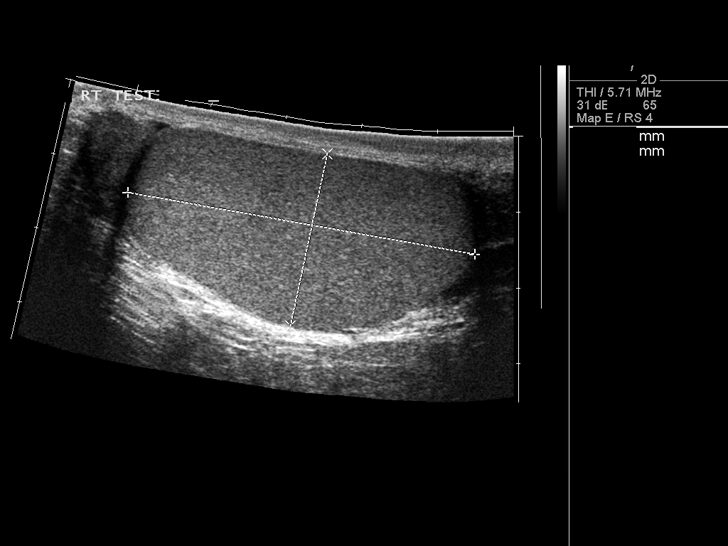

[14 of 25 positions shown; findings below may reference images not displayed]

FINDINGS: Right testicle

Measurements: 4.6 x 2.3 x 3.0 cm. No mass or microlithiasis
visualized.

Left testicle

Measurements: 4.8 x 2.2 x 3.3 cm. No mass or microlithiasis
visualized.

Right epididymis:  Normal in size and appearance.

Left epididymis:  Normal in size and appearance.

Hydrocele:  None visualized.

Varicocele:  Small left varicocele cannot be excluded.

Pulsed Doppler interrogation of both testes demonstrates normal low
resistance arterial and venous waveforms bilaterally.
IMPRESSION: 1. Small left varicocele.

2. Exam is otherwise unremarkable.

## 2018-07-25 DIAGNOSIS — F419 Anxiety disorder, unspecified: Secondary | ICD-10-CM | POA: Diagnosis not present

## 2018-07-25 DIAGNOSIS — Z8249 Family history of ischemic heart disease and other diseases of the circulatory system: Secondary | ICD-10-CM | POA: Diagnosis not present

## 2018-11-23 DIAGNOSIS — Z0000 Encounter for general adult medical examination without abnormal findings: Secondary | ICD-10-CM | POA: Diagnosis not present

## 2019-04-10 DIAGNOSIS — R5383 Other fatigue: Secondary | ICD-10-CM | POA: Diagnosis not present
